# Patient Record
Sex: Female | Born: 1955 | Race: White | Hispanic: No | Marital: Married | Smoking: Never smoker
Health system: Southern US, Community
[De-identification: ages and names within clinical notes are randomized; demographics above are authoritative.]

## PROBLEM LIST (undated history)

## (undated) DIAGNOSIS — M797 Fibromyalgia: Secondary | ICD-10-CM

## (undated) DIAGNOSIS — K219 Gastro-esophageal reflux disease without esophagitis: Secondary | ICD-10-CM

## (undated) DIAGNOSIS — Z9889 Other specified postprocedural states: Secondary | ICD-10-CM

## (undated) DIAGNOSIS — G4730 Sleep apnea, unspecified: Secondary | ICD-10-CM

## (undated) DIAGNOSIS — E119 Type 2 diabetes mellitus without complications: Secondary | ICD-10-CM

## (undated) DIAGNOSIS — E611 Iron deficiency: Secondary | ICD-10-CM

## (undated) DIAGNOSIS — M1990 Unspecified osteoarthritis, unspecified site: Secondary | ICD-10-CM

## (undated) DIAGNOSIS — R112 Nausea with vomiting, unspecified: Secondary | ICD-10-CM

## (undated) DIAGNOSIS — R51 Headache: Secondary | ICD-10-CM

## (undated) DIAGNOSIS — I10 Essential (primary) hypertension: Secondary | ICD-10-CM

## (undated) DIAGNOSIS — F419 Anxiety disorder, unspecified: Secondary | ICD-10-CM

## (undated) DIAGNOSIS — I7090 Unspecified atherosclerosis: Secondary | ICD-10-CM

## (undated) DIAGNOSIS — R519 Headache, unspecified: Secondary | ICD-10-CM

## (undated) DIAGNOSIS — K5900 Constipation, unspecified: Secondary | ICD-10-CM

## (undated) DIAGNOSIS — R0609 Other forms of dyspnea: Secondary | ICD-10-CM

## (undated) HISTORY — PX: SHOULDER SURGERY: SHX246

## (undated) HISTORY — PX: TRIGGER FINGER RELEASE: SHX641

## (undated) HISTORY — PX: APPENDECTOMY: SHX54

## (undated) HISTORY — PX: COLONOSCOPY: SHX174

## (undated) HISTORY — PX: BLADDER SUSPENSION: SHX72

## (undated) HISTORY — PX: CHOLECYSTECTOMY: SHX55

## (undated) HISTORY — PX: BILATERAL CARPAL TUNNEL RELEASE: SHX6508

## (undated) HISTORY — PX: ABDOMINAL HYSTERECTOMY: SHX81

## (undated) HISTORY — PX: BACK SURGERY: SHX140

## (undated) HISTORY — PX: KNEE ARTHROPLASTY: SHX992

---

## 2015-11-21 ENCOUNTER — Other Ambulatory Visit (HOSPITAL_COMMUNITY): Payer: Self-pay | Admitting: Neurological Surgery

## 2015-11-29 ENCOUNTER — Other Ambulatory Visit: Payer: Self-pay

## 2015-11-29 ENCOUNTER — Encounter (HOSPITAL_COMMUNITY)
Admission: RE | Admit: 2015-11-29 | Discharge: 2015-11-29 | Disposition: A | Discharge status: Home / Self Care | Payer: Medicaid Other | Source: Ambulatory Visit | Attending: Neurological Surgery | Admitting: Neurological Surgery

## 2015-11-29 ENCOUNTER — Encounter (HOSPITAL_COMMUNITY): Payer: Self-pay

## 2015-11-29 ENCOUNTER — Other Ambulatory Visit (HOSPITAL_COMMUNITY): Payer: Self-pay | Admitting: *Deleted

## 2015-11-29 DIAGNOSIS — Z79899 Other long term (current) drug therapy: Secondary | ICD-10-CM | POA: Diagnosis not present

## 2015-11-29 DIAGNOSIS — IMO0002 Reserved for concepts with insufficient information to code with codable children: Secondary | ICD-10-CM

## 2015-11-29 DIAGNOSIS — M5126 Other intervertebral disc displacement, lumbar region: Secondary | ICD-10-CM | POA: Insufficient documentation

## 2015-11-29 DIAGNOSIS — Z794 Long term (current) use of insulin: Secondary | ICD-10-CM | POA: Insufficient documentation

## 2015-11-29 DIAGNOSIS — Z01812 Encounter for preprocedural laboratory examination: Secondary | ICD-10-CM | POA: Insufficient documentation

## 2015-11-29 DIAGNOSIS — K219 Gastro-esophageal reflux disease without esophagitis: Secondary | ICD-10-CM | POA: Insufficient documentation

## 2015-11-29 DIAGNOSIS — M797 Fibromyalgia: Secondary | ICD-10-CM | POA: Diagnosis not present

## 2015-11-29 DIAGNOSIS — R9431 Abnormal electrocardiogram [ECG] [EKG]: Secondary | ICD-10-CM | POA: Diagnosis not present

## 2015-11-29 DIAGNOSIS — Z01818 Encounter for other preprocedural examination: Secondary | ICD-10-CM | POA: Diagnosis present

## 2015-11-29 DIAGNOSIS — E119 Type 2 diabetes mellitus without complications: Secondary | ICD-10-CM | POA: Diagnosis not present

## 2015-11-29 DIAGNOSIS — G4733 Obstructive sleep apnea (adult) (pediatric): Secondary | ICD-10-CM | POA: Diagnosis not present

## 2015-11-29 HISTORY — DX: Iron deficiency: E61.1

## 2015-11-29 HISTORY — DX: Type 2 diabetes mellitus without complications: E11.9

## 2015-11-29 HISTORY — DX: Constipation, unspecified: K59.00

## 2015-11-29 HISTORY — DX: Gastro-esophageal reflux disease without esophagitis: K21.9

## 2015-11-29 HISTORY — DX: Headache, unspecified: R51.9

## 2015-11-29 HISTORY — DX: Sleep apnea, unspecified: G47.30

## 2015-11-29 HISTORY — DX: Nausea with vomiting, unspecified: R11.2

## 2015-11-29 HISTORY — DX: Other specified postprocedural states: Z98.890

## 2015-11-29 HISTORY — DX: Headache: R51

## 2015-11-29 HISTORY — DX: Fibromyalgia: M79.7

## 2015-11-29 HISTORY — DX: Unspecified osteoarthritis, unspecified site: M19.90

## 2015-11-29 LAB — CBC WITH DIFFERENTIAL/PLATELET
BASOS ABS: 0 10*3/uL (ref 0.0–0.1)
Basophils Relative: 0 %
EOS PCT: 4 %
Eosinophils Absolute: 0.4 10*3/uL (ref 0.0–0.7)
HEMATOCRIT: 45.7 % (ref 36.0–46.0)
HEMOGLOBIN: 14.5 g/dL (ref 12.0–15.0)
LYMPHS ABS: 3.3 10*3/uL (ref 0.7–4.0)
LYMPHS PCT: 33 %
MCH: 29.8 pg (ref 26.0–34.0)
MCHC: 31.7 g/dL (ref 30.0–36.0)
MCV: 93.8 fL (ref 78.0–100.0)
Monocytes Absolute: 0.4 10*3/uL (ref 0.1–1.0)
Monocytes Relative: 4 %
NEUTROS ABS: 6 10*3/uL (ref 1.7–7.7)
NEUTROS PCT: 59 %
Platelets: 195 10*3/uL (ref 150–400)
RBC: 4.87 MIL/uL (ref 3.87–5.11)
RDW: 14.3 % (ref 11.5–15.5)
WBC: 10.1 10*3/uL (ref 4.0–10.5)

## 2015-11-29 LAB — SURGICAL PCR SCREEN
MRSA, PCR: NEGATIVE
Staphylococcus aureus: NEGATIVE

## 2015-11-29 LAB — BASIC METABOLIC PANEL
ANION GAP: 11 (ref 5–15)
BUN: 9 mg/dL (ref 6–20)
CHLORIDE: 104 mmol/L (ref 101–111)
CO2: 26 mmol/L (ref 22–32)
Calcium: 9.3 mg/dL (ref 8.9–10.3)
Creatinine, Ser: 0.75 mg/dL (ref 0.44–1.00)
GFR calc Af Amer: >60 mL/min (ref >60)
GFR calc non Af Amer: >60 mL/min (ref >60)
Glucose, Bld: 135 mg/dL — ABNORMAL HIGH (ref 65–99)
POTASSIUM: 4.6 mmol/L (ref 3.5–5.1)
SODIUM: 141 mmol/L (ref 135–145)

## 2015-11-29 LAB — PROTIME-INR
INR: 0.94 (ref 0.00–1.49)
Prothrombin Time: 12.8 seconds (ref 11.6–15.2)

## 2015-11-29 LAB — GLUCOSE, CAPILLARY: Glucose-Capillary: 135 mg/dL — ABNORMAL HIGH (ref 65–99)

## 2015-11-29 NOTE — Progress Notes (Addendum)
Pt denies cardiac history, chest pain or sob. Pt is diabetic, thinks she had an A1C done about 3 months ago (not found in Care Everywhere) and states "it was good, 6.something". She states her fasting blood sugar usually runs between 100-120. Today's CBG was 135.  Pt is under the care of Dr. Rosario Jacks for pain management of her fibromyalgia.  EKG requested from Dr. Rodman Comp office for comparison use.

## 2015-11-29 NOTE — Pre-Procedure Instructions (Signed)
LASHONDRA VAQUERANO  11/29/2015     Your procedure is scheduled on Friday, December 01, 2015 at 11:20 AM.   Report to [facility] Entrance "A" Admitting Office at 9:15 AM.   Call this number if you have problems the morning of surgery: [phone]   Any questions prior to day of surgery, please call [phone] between 8 & 4 PM.   Remember:  Do not eat food or drink liquids after midnight Thursday, 11/30/15.  Take these medicines the morning of surgery with A SIP OF WATER: Buspirone (Buspar), Duloxetine (Cymbalta), Gabapentin (Neurontin), Tramadol - if needed. Stop NSAIDS (Diclofenac, Ibuprofen, Aleve, etc.) as of today.  How to Manage Your Diabetes Before Surgery   Why is it important to control my blood sugar before and after surgery?   Improving blood sugar levels before and after surgery helps healing and can limit problems.  A way of improving blood sugar control is eating a healthy diet by:  - Eating less sugar and carbohydrates  - Increasing activity/exercise  - Talk with your doctor about reaching your blood sugar goals  High blood sugars (greater than 180 mg/dL) can raise your risk of infections and slow down your recovery so you will need to focus on controlling your diabetes during the weeks before surgery.  Make sure that the doctor who takes care of your diabetes knows about your planned surgery including the date and location.  How do I manage my blood sugars before surgery?   Check your blood sugar at least 4 times a day, 2 days before surgery to make sure that they are not too high or low.   Check your blood sugar the morning of your surgery when you wake up and every 2 hours until you get to the Short-Stay unit.   Treat a low blood sugar (less than 70 mg/dL) with 1/2 cup of clear juice (cranberry or apple), 4 glucose tablets, OR glucose gel.   Recheck blood sugar in 15 minutes after treatment (to make sure it is greater than 70 mg/dL).  If  blood sugar is not greater than 70 mg/dL on re-check, call [phone] for further instructions.    Report your blood sugar to the Short-Stay nurse when you get to Short-Stay.  References:  [facility], 2007 "How to Manage your Diabetes Before and After Surgery".  What do I do about my diabetes medications?   Do not take oral diabetes medicines (pills) the morning of surgery.   THE NIGHT BEFORE SURGERY, take 32 units of Lantus Insulin.  THE NIGHT BEFORE SURGERY, take your regular dose of Victoza   Do not wear jewelry, make-up or nail polish.  Do not wear lotions, powders, or perfumes.  You may wear deodorant.  Do not shave 48 hours prior to surgery.    Do not bring valuables to the hospital.  [facility] is not responsible for any belongings or valuables.  Contacts, dentures or bridgework may not be worn into surgery.  Leave your suitcase in the car.  After surgery it may be brought to your room.  For patients admitted to the hospital, discharge time will be determined by your treatment team.   Special instructions: [place] - Preparing for Surgery  Before surgery, you can play an important role.  Because skin is not sterile, your skin needs to be as free of germs as possible.  You can reduce the number of germs on you skin by washing with CHG (  chlorahexidine gluconate) soap before surgery.  CHG is an antiseptic cleaner which kills germs and bonds with the skin to continue killing germs even after washing.  Please DO NOT use if you have an allergy to CHG or antibacterial soaps.  If your skin becomes reddened/irritated stop using the CHG and inform your nurse when you arrive at Short Stay.  Do not shave (including legs and underarms) for at least 48 hours prior to the first CHG shower.  You may shave your face.  Please follow these instructions carefully:   1.  Shower with CHG Soap the night before surgery and the                                 morning of Surgery.  2.  If you choose to wash your hair, wash your hair first as usual with your       normal shampoo.  3.  After you shampoo, rinse your hair and body thoroughly to remove the                      Shampoo.  4.  Use CHG as you would any other liquid soap.  You can apply chg directly       to the skin and wash gently with scrungie or a clean washcloth.  5.  Apply the CHG Soap to your body ONLY FROM THE NECK DOWN.        Do not use on open wounds or open sores.  Avoid contact with your eyes, ears, mouth and genitals (private parts).  Wash genitals (private parts) with your normal soap.  6.  Wash thoroughly, paying special attention to the area where your surgery        will be performed.  7.  Thoroughly rinse your body with warm water from the neck down.  8.  DO NOT shower/wash with your normal soap after using and rinsing off       the CHG Soap.  9.  Pat yourself dry with a clean towel.            10.  Wear clean pajamas.            11.  Place clean sheets on your bed the night of your first shower and do not        sleep with pets.  Day of Surgery  Do not apply any lotions the morning of surgery.  Please wear clean clothes to the hospital.   Please read over the following fact sheets that you were given. Pain Booklet, Coughing and Deep Breathing, MRSA Information and Surgical Site Infection Prevention

## 2015-11-29 NOTE — Progress Notes (Addendum)
Anesthesia Chart Review: Patient is a 59 year old female scheduled for extra microdiscectomy L3-4 on 12/01/15 by Dr. Yetta Barre.  History includes non-smoker, post-op N/V, DM2, OSA (occasional CPAP use), GERD, migraines, arthritis, fibromyalgia (Dr. Rosario Jacks), iron deficiency, cholecystectomy, hysterectomy, appendectomy. BMI is consistent with obesity. PCP is Dr. Brooke Bonito, last visit 11/27/15 for URI and also neck cellulitis from parrot bite (started on 7 day course of doxycycline). No fever. He described her right neck wound as "resolving." He is aware of surgery plans.    Meds include Buspar, Invokana, Welchol, Flexeril, Voltaren, doxycycline, Nexium, Lasix, Neurontin, Lantus, Victoza, Claritin, metformin, tramadol.   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  11/29/15 CXR: IMPRESSION: No active disease.  Preoperative labs noted. A1C is pending. Reports fasting glucose runs ~ 100-120.  EKG reviewed with anesthesiologist Dr. Desmond Lope. Will follow-up if any additional tracing from her PCP. She denied CP and SOB.  Will follow-up with Dr. Yetta Barre' office to make sure they are aware of patient's current treatment for cellulitis.  Velna Ochs [facility] Short Stay Center/Anesthesiology Phone [phone] 11/29/2015 5:51 PM  Addendum: I called and spoke with patient. She feels her URI is getting better. Still has some sinus drainage. No fever. Occasional cough, scant "watery" discharge. No wheezing. She said parrot bite was from two weeks ago (he nipped at her neck in attempt to prevent falling). She said it scabbed and was a little read beneath the scab so her PCP started her on antibiotics. She is not inclined to think it is acutely infected. She denied CP, SOB or history of CAD/MI. Her activity has been limited due to her back pain which is progressively worsening. We did get a comparison EKG from 2010 from [facility] which shows  poor anterior r wave progression and consider inferior infarct which I think is overall similar to her tracing from 11/29/15. I have notified Erie Noe at Dr. Yetta Barre' office regarding patients recent URI, parrot bite, and that she is on doxycycline. Patient describes to me rather mild symptoms. I was not asked to evaluate her at PAT. I did notify her that if she appears ill, has wheezing, fever, or if her neck looks infected that these could all affect the timing of her surgery. Definitve anesthesia plan following surgeon and anesthesiologist evaluation tomorrow.  Velna Ochs [facility] Short Stay Center/Anesthesiology Phone [phone] 11/30/2015 11:08 AM

## 2015-11-30 LAB — HEMOGLOBIN A1C
Hgb A1c MFr Bld: 6.2 % — ABNORMAL HIGH (ref 4.8–5.6)
MEAN PLASMA GLUCOSE: 131 mg/dL

## 2015-11-30 MED ORDER — DEXAMETHASONE SODIUM PHOSPHATE 10 MG/ML IJ SOLN
10.0000 mg | INTRAMUSCULAR | Status: DC
  Filled 2015-11-30: qty 1

## 2015-11-30 MED ORDER — CEFAZOLIN SODIUM-DEXTROSE 2-3 GM-% IV SOLR
2.0000 g | INTRAVENOUS | Status: AC
  Administered 2015-12-01: 2 g via INTRAVENOUS
  Filled 2015-11-30: qty 50

## 2015-12-01 ENCOUNTER — Ambulatory Visit (HOSPITAL_COMMUNITY): Payer: Medicaid Other | Admitting: Vascular Surgery

## 2015-12-01 ENCOUNTER — Inpatient Hospital Stay (HOSPITAL_COMMUNITY)
Admission: RE | Admit: 2015-12-01 | Discharge: 2015-12-01 | DRG: 520 | Disposition: A | Discharge status: Home / Self Care | Payer: Medicaid Other | Source: Ambulatory Visit | Attending: Neurological Surgery | Admitting: Neurological Surgery

## 2015-12-01 ENCOUNTER — Encounter (HOSPITAL_COMMUNITY): Payer: Self-pay | Admitting: *Deleted

## 2015-12-01 ENCOUNTER — Encounter (HOSPITAL_COMMUNITY)
Admission: RE | Disposition: A | Discharge status: Home / Self Care | Payer: Self-pay | Source: Ambulatory Visit | Attending: Neurological Surgery

## 2015-12-01 ENCOUNTER — Ambulatory Visit (HOSPITAL_COMMUNITY): Payer: Medicaid Other | Admitting: Certified Registered Nurse Anesthetist

## 2015-12-01 ENCOUNTER — Ambulatory Visit (HOSPITAL_COMMUNITY): Payer: Medicaid Other

## 2015-12-01 DIAGNOSIS — M797 Fibromyalgia: Secondary | ICD-10-CM | POA: Diagnosis present

## 2015-12-01 DIAGNOSIS — Z79899 Other long term (current) drug therapy: Secondary | ICD-10-CM

## 2015-12-01 DIAGNOSIS — Z888 Allergy status to other drugs, medicaments and biological substances status: Secondary | ICD-10-CM | POA: Diagnosis not present

## 2015-12-01 DIAGNOSIS — Z7984 Long term (current) use of oral hypoglycemic drugs: Secondary | ICD-10-CM | POA: Diagnosis not present

## 2015-12-01 DIAGNOSIS — G4730 Sleep apnea, unspecified: Secondary | ICD-10-CM | POA: Diagnosis present

## 2015-12-01 DIAGNOSIS — Z794 Long term (current) use of insulin: Secondary | ICD-10-CM | POA: Diagnosis not present

## 2015-12-01 DIAGNOSIS — M5126 Other intervertebral disc displacement, lumbar region: Principal | ICD-10-CM | POA: Diagnosis present

## 2015-12-01 DIAGNOSIS — Z9889 Other specified postprocedural states: Secondary | ICD-10-CM

## 2015-12-01 DIAGNOSIS — Z419 Encounter for procedure for purposes other than remedying health state, unspecified: Secondary | ICD-10-CM

## 2015-12-01 DIAGNOSIS — E119 Type 2 diabetes mellitus without complications: Secondary | ICD-10-CM | POA: Diagnosis present

## 2015-12-01 HISTORY — PX: LUMBAR LAMINECTOMY/DECOMPRESSION MICRODISCECTOMY: SHX5026

## 2015-12-01 LAB — GLUCOSE, CAPILLARY
GLUCOSE-CAPILLARY: 106 mg/dL — AB (ref 65–99)
GLUCOSE-CAPILLARY: 89 mg/dL (ref 65–99)
Glucose-Capillary: 123 mg/dL — ABNORMAL HIGH (ref 65–99)

## 2015-12-01 SURGERY — LUMBAR LAMINECTOMY/DECOMPRESSION MICRODISCECTOMY 1 LEVEL
Anesthesia: General | Site: Back | Laterality: Right

## 2015-12-01 MED ORDER — EPHEDRINE SULFATE 50 MG/ML IJ SOLN
INTRAMUSCULAR | Status: AC
  Filled 2015-12-01: qty 1

## 2015-12-01 MED ORDER — CEFAZOLIN SODIUM 1-5 GM-% IV SOLN
1.0000 g | Freq: Four times a day (QID) | INTRAVENOUS | Status: DC
  Administered 2015-12-01: 1 g via INTRAVENOUS
  Filled 2015-12-01 (×2): qty 50

## 2015-12-01 MED ORDER — INSULIN ASPART 100 UNIT/ML ~~LOC~~ SOLN
0.0000 [IU] | Freq: Three times a day (TID) | SUBCUTANEOUS | Status: DC
  Filled 2015-12-01 (×25): qty 0.15

## 2015-12-01 MED ORDER — SCOPOLAMINE 1 MG/3DAYS TD PT72
MEDICATED_PATCH | TRANSDERMAL | Status: DC | PRN
  Administered 2015-12-01: 1 via TRANSDERMAL

## 2015-12-01 MED ORDER — POTASSIUM CHLORIDE IN NACL 20-0.9 MEQ/L-% IV SOLN
INTRAVENOUS | Status: DC
  Administered 2015-12-01: 16:00:00 via INTRAVENOUS
  Filled 2015-12-01 (×2): qty 1000

## 2015-12-01 MED ORDER — ROCURONIUM BROMIDE 50 MG/5ML IV SOLN
INTRAVENOUS | Status: AC
  Filled 2015-12-01: qty 1

## 2015-12-01 MED ORDER — OXYCODONE-ACETAMINOPHEN 5-325 MG PO TABS: 1.0000 | ORAL_TABLET | Freq: Four times a day (QID) | ORAL | Status: DC | PRN

## 2015-12-01 MED ORDER — GLYCOPYRROLATE 0.2 MG/ML IJ SOLN
INTRAMUSCULAR | Status: AC
  Filled 2015-12-01: qty 3

## 2015-12-01 MED ORDER — ACETAMINOPHEN 650 MG RE SUPP: 650.0000 mg | RECTAL | Status: DC | PRN

## 2015-12-01 MED ORDER — CYCLOBENZAPRINE HCL 10 MG PO TABS
10.0000 mg | ORAL_TABLET | Freq: Three times a day (TID) | ORAL | Status: DC | PRN
  Administered 2015-12-01 (×2): 10 mg via ORAL
  Filled 2015-12-01 (×2): qty 1

## 2015-12-01 MED ORDER — OXYCODONE-ACETAMINOPHEN 5-325 MG PO TABS
1.0000 | ORAL_TABLET | ORAL | Status: DC | PRN
  Administered 2015-12-01 (×2): 2 via ORAL
  Filled 2015-12-01 (×2): qty 2

## 2015-12-01 MED ORDER — CANAGLIFLOZIN 300 MG PO TABS
300.0000 mg | ORAL_TABLET | Freq: Every day | ORAL | Status: DC
  Filled 2015-12-01: qty 300

## 2015-12-01 MED ORDER — MIDAZOLAM HCL 5 MG/5ML IJ SOLN
INTRAMUSCULAR | Status: DC | PRN
  Administered 2015-12-01: 2 mg via INTRAVENOUS

## 2015-12-01 MED ORDER — 0.9 % SODIUM CHLORIDE (POUR BTL) OPTIME
TOPICAL | Status: DC | PRN
  Administered 2015-12-01: 1000 mL

## 2015-12-01 MED ORDER — LACTATED RINGERS IV SOLN
INTRAVENOUS | Status: DC
  Administered 2015-12-01 (×3): via INTRAVENOUS

## 2015-12-01 MED ORDER — ACETAMINOPHEN 325 MG PO TABS: 650.0000 mg | ORAL_TABLET | ORAL | Status: DC | PRN

## 2015-12-01 MED ORDER — ONDANSETRON HCL 4 MG/2ML IJ SOLN
INTRAMUSCULAR | Status: AC
  Filled 2015-12-01: qty 2

## 2015-12-01 MED ORDER — PROPOFOL 10 MG/ML IV BOLUS
INTRAVENOUS | Status: AC
  Filled 2015-12-01: qty 20

## 2015-12-01 MED ORDER — HYDROMORPHONE HCL 1 MG/ML IJ SOLN
0.2500 mg | INTRAMUSCULAR | Status: DC | PRN
  Administered 2015-12-01 (×2): 0.5 mg via INTRAVENOUS

## 2015-12-01 MED ORDER — PROPOFOL 10 MG/ML IV BOLUS
INTRAVENOUS | Status: DC | PRN
  Administered 2015-12-01: 150 mg via INTRAVENOUS

## 2015-12-01 MED ORDER — DIPHENHYDRAMINE HCL 50 MG/ML IJ SOLN
INTRAMUSCULAR | Status: AC
  Filled 2015-12-01: qty 1

## 2015-12-01 MED ORDER — HYDROMORPHONE HCL 1 MG/ML IJ SOLN
INTRAMUSCULAR | Status: AC
  Filled 2015-12-01: qty 1

## 2015-12-01 MED ORDER — PHENYLEPHRINE 40 MCG/ML (10ML) SYRINGE FOR IV PUSH (FOR BLOOD PRESSURE SUPPORT)
PREFILLED_SYRINGE | INTRAVENOUS | Status: AC
  Filled 2015-12-01: qty 10

## 2015-12-01 MED ORDER — BUPIVACAINE HCL (PF) 0.25 % IJ SOLN
INTRAMUSCULAR | Status: DC | PRN
  Administered 2015-12-01: 4 mL via INTRA_ARTICULAR

## 2015-12-01 MED ORDER — THROMBIN 5000 UNITS EX SOLR
CUTANEOUS | Status: DC | PRN
  Administered 2015-12-01 (×3): 5000 [IU] via TOPICAL

## 2015-12-01 MED ORDER — DIPHENHYDRAMINE HCL 50 MG/ML IJ SOLN
INTRAMUSCULAR | Status: DC | PRN
  Administered 2015-12-01: 6.25 mg via INTRAVENOUS

## 2015-12-01 MED ORDER — NEOSTIGMINE METHYLSULFATE 10 MG/10ML IV SOLN
INTRAVENOUS | Status: DC | PRN
  Administered 2015-12-01: 4 mg via INTRAVENOUS

## 2015-12-01 MED ORDER — LIDOCAINE HCL (CARDIAC) 20 MG/ML IV SOLN
INTRAVENOUS | Status: DC | PRN
  Administered 2015-12-01: 100 mg via INTRAVENOUS

## 2015-12-01 MED ORDER — LIDOCAINE HCL (CARDIAC) 20 MG/ML IV SOLN
INTRAVENOUS | Status: AC
  Filled 2015-12-01: qty 5

## 2015-12-01 MED ORDER — MORPHINE SULFATE (PF) 2 MG/ML IV SOLN
2.0000 mg | INTRAVENOUS | Status: DC | PRN
  Administered 2015-12-01 (×2): 4 mg via INTRAVENOUS
  Filled 2015-12-01 (×2): qty 2

## 2015-12-01 MED ORDER — HEMOSTATIC AGENTS (NO CHARGE) OPTIME
TOPICAL | Status: DC | PRN
  Administered 2015-12-01: 1 via TOPICAL

## 2015-12-01 MED ORDER — SODIUM CHLORIDE 0.9 % IJ SOLN: 3.0000 mL | Freq: Two times a day (BID) | INTRAMUSCULAR | Status: DC

## 2015-12-01 MED ORDER — NEOSTIGMINE METHYLSULFATE 10 MG/10ML IV SOLN
INTRAVENOUS | Status: AC
  Filled 2015-12-01: qty 1

## 2015-12-01 MED ORDER — SCOPOLAMINE 1 MG/3DAYS TD PT72
MEDICATED_PATCH | TRANSDERMAL | Status: AC
  Filled 2015-12-01: qty 1

## 2015-12-01 MED ORDER — SODIUM CHLORIDE 0.9 % IV SOLN: 250.0000 mL | INTRAVENOUS | Status: DC

## 2015-12-01 MED ORDER — BUSPIRONE HCL 10 MG PO TABS: 10.0000 mg | ORAL_TABLET | Freq: Two times a day (BID) | ORAL | Status: DC

## 2015-12-01 MED ORDER — PROMETHAZINE HCL 25 MG/ML IJ SOLN: 6.2500 mg | INTRAMUSCULAR | Status: DC | PRN

## 2015-12-01 MED ORDER — PHENOL 1.4 % MT LIQD
1.0000 | OROMUCOSAL | Status: DC | PRN
Start: 2015-12-01 — End: 2015-12-02

## 2015-12-01 MED ORDER — ONDANSETRON HCL 4 MG/2ML IJ SOLN: 4.0000 mg | INTRAMUSCULAR | Status: DC | PRN

## 2015-12-01 MED ORDER — FENTANYL CITRATE (PF) 250 MCG/5ML IJ SOLN
INTRAMUSCULAR | Status: AC
  Filled 2015-12-01: qty 5

## 2015-12-01 MED ORDER — MIDAZOLAM HCL 2 MG/2ML IJ SOLN
INTRAMUSCULAR | Status: AC
  Filled 2015-12-01: qty 2

## 2015-12-01 MED ORDER — THROMBIN 5000 UNITS EX SOLR
OROMUCOSAL | Status: DC | PRN
  Administered 2015-12-01: 12:00:00 via TOPICAL

## 2015-12-01 MED ORDER — ONDANSETRON HCL 4 MG/2ML IJ SOLN
INTRAMUSCULAR | Status: DC | PRN
  Administered 2015-12-01: 4 mg via INTRAVENOUS

## 2015-12-01 MED ORDER — LIRAGLUTIDE 18 MG/3ML ~~LOC~~ SOPN: 18.0000 mg | PEN_INJECTOR | Freq: Every day | SUBCUTANEOUS | Status: DC

## 2015-12-01 MED ORDER — FENTANYL CITRATE (PF) 100 MCG/2ML IJ SOLN
INTRAMUSCULAR | Status: DC | PRN
  Administered 2015-12-01 (×2): 50 ug via INTRAVENOUS
  Administered 2015-12-01: 100 ug via INTRAVENOUS
  Administered 2015-12-01: 50 ug via INTRAVENOUS

## 2015-12-01 MED ORDER — BACITRACIN 50000 UNITS IM SOLR
INTRAMUSCULAR | Status: DC | PRN
  Administered 2015-12-01: 12:00:00

## 2015-12-01 MED ORDER — SODIUM CHLORIDE 0.9 % IJ SOLN: 3.0000 mL | INTRAMUSCULAR | Status: DC | PRN

## 2015-12-01 MED ORDER — ROCURONIUM BROMIDE 100 MG/10ML IV SOLN
INTRAVENOUS | Status: DC | PRN
  Administered 2015-12-01: 10 mg via INTRAVENOUS
  Administered 2015-12-01: 50 mg via INTRAVENOUS
  Administered 2015-12-01: 10 mg via INTRAVENOUS

## 2015-12-01 MED ORDER — FUROSEMIDE 40 MG PO TABS: 40.0000 mg | ORAL_TABLET | Freq: Every day | ORAL | Status: DC

## 2015-12-01 MED ORDER — GABAPENTIN 400 MG PO CAPS
800.0000 mg | ORAL_CAPSULE | Freq: Two times a day (BID) | ORAL | Status: DC
  Administered 2015-12-01: 800 mg via ORAL
  Filled 2015-12-01: qty 2

## 2015-12-01 MED ORDER — METFORMIN HCL 500 MG PO TABS: 500.0000 mg | ORAL_TABLET | Freq: Three times a day (TID) | ORAL | Status: DC

## 2015-12-01 MED ORDER — GLYCOPYRROLATE 0.2 MG/ML IJ SOLN
INTRAMUSCULAR | Status: DC | PRN
  Administered 2015-12-01: 0.6 mg via INTRAVENOUS

## 2015-12-01 MED ORDER — ALBUTEROL SULFATE HFA 108 (90 BASE) MCG/ACT IN AERS
INHALATION_SPRAY | RESPIRATORY_TRACT | Status: DC | PRN
  Administered 2015-12-01: 8 via RESPIRATORY_TRACT

## 2015-12-01 MED ORDER — SUCCINYLCHOLINE CHLORIDE 20 MG/ML IJ SOLN
INTRAMUSCULAR | Status: AC
  Filled 2015-12-01: qty 1

## 2015-12-01 MED ORDER — MENTHOL 3 MG MT LOZG: 1.0000 | LOZENGE | OROMUCOSAL | Status: DC | PRN

## 2015-12-01 MED ORDER — TEMAZEPAM 15 MG PO CAPS
30.0000 mg | ORAL_CAPSULE | Freq: Every day | ORAL | Status: DC
  Administered 2015-12-01: 30 mg via ORAL
  Filled 2015-12-01: qty 2

## 2015-12-01 MED ORDER — PANTOPRAZOLE SODIUM 40 MG PO TBEC
40.0000 mg | DELAYED_RELEASE_TABLET | Freq: Every day | ORAL | Status: DC
  Administered 2015-12-01: 40 mg via ORAL
  Filled 2015-12-01: qty 1

## 2015-12-01 MED ORDER — PROPOFOL 500 MG/50ML IV EMUL
INTRAVENOUS | Status: DC | PRN
  Administered 2015-12-01: 150 ug/kg/min via INTRAVENOUS

## 2015-12-01 MED ORDER — METOCLOPRAMIDE HCL 5 MG/ML IJ SOLN
INTRAMUSCULAR | Status: AC
  Filled 2015-12-01: qty 2

## 2015-12-01 MED ORDER — DOXYCYCLINE HYCLATE 100 MG PO TABS
100.0000 mg | ORAL_TABLET | Freq: Two times a day (BID) | ORAL | Status: DC
  Administered 2015-12-01: 100 mg via ORAL
  Filled 2015-12-01: qty 1

## 2015-12-01 MED ORDER — DULOXETINE HCL 60 MG PO CPEP
60.0000 mg | ORAL_CAPSULE | Freq: Every day | ORAL | Status: DC
  Administered 2015-12-01: 60 mg via ORAL
  Filled 2015-12-01: qty 1

## 2015-12-01 SURGICAL SUPPLY — 40 items
BAG DECANTER FOR FLEXI CONT (MISCELLANEOUS) ×3 IMPLANT
BENZOIN TINCTURE PRP APPL 2/3 (GAUZE/BANDAGES/DRESSINGS) ×3 IMPLANT
BUR MATCHSTICK NEURO 3.0 LAGG (BURR) ×3 IMPLANT
CANISTER SUCT 3000ML PPV (MISCELLANEOUS) ×3 IMPLANT
CLOSURE WOUND 1/2 X4 (GAUZE/BANDAGES/DRESSINGS) ×1
DRAPE LAPAROTOMY 100X72X124 (DRAPES) ×3 IMPLANT
DRAPE MICROSCOPE LEICA (MISCELLANEOUS) ×3 IMPLANT
DRAPE POUCH INSTRU U-SHP 10X18 (DRAPES) ×3 IMPLANT
DRAPE SURG 17X23 STRL (DRAPES) ×3 IMPLANT
DRSG OPSITE POSTOP 4X6 (GAUZE/BANDAGES/DRESSINGS) ×3 IMPLANT
DURAPREP 26ML APPLICATOR (WOUND CARE) ×3 IMPLANT
ELECT REM PT RETURN 9FT ADLT (ELECTROSURGICAL) ×3
ELECTRODE REM PT RTRN 9FT ADLT (ELECTROSURGICAL) ×1 IMPLANT
GAUZE SPONGE 4X4 16PLY XRAY LF (GAUZE/BANDAGES/DRESSINGS) IMPLANT
GLOVE BIO SURGEON STRL SZ8 (GLOVE) ×3 IMPLANT
GLOVE ECLIPSE 7.5 STRL STRAW (GLOVE) ×3 IMPLANT
GLOVE INDICATOR 8.0 STRL GRN (GLOVE) ×3 IMPLANT
GOWN STRL REUS W/ TWL LRG LVL3 (GOWN DISPOSABLE) IMPLANT
GOWN STRL REUS W/ TWL XL LVL3 (GOWN DISPOSABLE) ×1 IMPLANT
GOWN STRL REUS W/TWL 2XL LVL3 (GOWN DISPOSABLE) IMPLANT
GOWN STRL REUS W/TWL LRG LVL3 (GOWN DISPOSABLE)
GOWN STRL REUS W/TWL XL LVL3 (GOWN DISPOSABLE) ×2
HEMOSTAT POWDER KIT SURGIFOAM (HEMOSTASIS) IMPLANT
KIT BASIN OR (CUSTOM PROCEDURE TRAY) ×3 IMPLANT
KIT ROOM TURNOVER OR (KITS) ×3 IMPLANT
NEEDLE HYPO 25X1 1.5 SAFETY (NEEDLE) ×3 IMPLANT
NEEDLE SPNL 20GX3.5 QUINCKE YW (NEEDLE) ×3 IMPLANT
NS IRRIG 1000ML POUR BTL (IV SOLUTION) ×3 IMPLANT
PACK LAMINECTOMY NEURO (CUSTOM PROCEDURE TRAY) ×3 IMPLANT
PAD ARMBOARD 7.5X6 YLW CONV (MISCELLANEOUS) ×9 IMPLANT
RUBBERBAND STERILE (MISCELLANEOUS) ×6 IMPLANT
SPONGE SURGIFOAM ABS GEL SZ50 (HEMOSTASIS) ×3 IMPLANT
STRIP CLOSURE SKIN 1/2X4 (GAUZE/BANDAGES/DRESSINGS) ×2 IMPLANT
SUT VIC AB 0 CT1 18XCR BRD8 (SUTURE) ×1 IMPLANT
SUT VIC AB 0 CT1 8-18 (SUTURE) ×2
SUT VIC AB 2-0 CP2 18 (SUTURE) ×3 IMPLANT
SUT VIC AB 3-0 SH 8-18 (SUTURE) ×3 IMPLANT
TOWEL OR 17X24 6PK STRL BLUE (TOWEL DISPOSABLE) ×3 IMPLANT
TOWEL OR 17X26 10 PK STRL BLUE (TOWEL DISPOSABLE) ×3 IMPLANT
WATER STERILE IRR 1000ML POUR (IV SOLUTION) ×3 IMPLANT

## 2015-12-01 NOTE — Transfer of Care (Signed)
Immediate Anesthesia Transfer of Care Note  Patient: Gabrielle Esparza  Procedure(s) Performed: Procedure(s):  extraforaminal Microdiscectomy  - Right Lumbar three-lumbar four  (Right)  Patient Location: PACU  Anesthesia Type:General  Level of Consciousness: awake  Airway & Oxygen Therapy: Patient Spontanous Breathing and Patient connected to face mask oxygen  Post-op Assessment: Report given to RN, Post -op Vital signs reviewed and stable and Patient moving all extremities X 4  Post vital signs: stable  Last Vitals:  Filed Vitals:   12/01/15 0906  BP: 131/71  Pulse: 75  Temp: 36.7 C  Resp: 18    Complications: No apparent anesthesia complications

## 2015-12-01 NOTE — Anesthesia Preprocedure Evaluation (Addendum)
Anesthesia Evaluation  Patient identified by MRN, date of birth, ID band Patient awake    Reviewed: Allergy & Precautions, NPO status , Patient's Chart, lab work & pertinent test results  History of Anesthesia Complications (+) PONV and history of anesthetic complications  Airway Mallampati: II  TM Distance: <3 FB Neck ROM: Full    Dental  (+) Teeth Intact, Dental Advisory Given, Caps   Pulmonary sleep apnea and Continuous Positive Airway Pressure Ventilation , Recent URI , Residual Cough,    Pulmonary exam normal breath sounds clear to auscultation       Cardiovascular Exercise Tolerance: Good (-) hypertension(-) angina(-) CAD and (-) Past MI Normal cardiovascular exam Rhythm:Regular Rate:Normal  11/29/15 EKG: NSR, inferior infarct (age undetermined), anterior infarct (age undetermined).   Neuro/Psych  Headaches, PSYCHIATRIC DISORDERS Anxiety Depression    GI/Hepatic Neg liver ROS, GERD  Medicated,  Endo/Other  diabetes, Type 2, Insulin Dependent, Oral Hypoglycemic AgentsObesity   Renal/GU negative Renal ROS     Musculoskeletal  (+) Arthritis , Fibromyalgia -, narcotic dependent  Abdominal   Peds  Hematology negative hematology ROS (+)   Anesthesia Other Findings Day of surgery medications reviewed with the patient.  Parrot bite to neck 2 weeks ago.  Started on doxycycline by PCP for 7 day course starting 11/27/15  Reproductive/Obstetrics                         Anesthesia Physical Anesthesia Plan  ASA: III  Anesthesia Plan: General   Post-op Pain Management:    Induction: Intravenous  Airway Management Planned: Oral ETT  Additional Equipment:   Intra-op Plan:   Post-operative Plan: Extubation in OR  Informed Consent: I have reviewed the patients History and Physical, chart, labs and discussed the procedure including the risks, benefits and alternatives for the proposed  anesthesia with the patient or authorized representative who has indicated his/her understanding and acceptance.   Dental advisory given  Plan Discussed with: CRNA  Anesthesia Plan Comments: (Risks/benefits of general anesthesia discussed with patient including risk of damage to teeth, lips, gum, and tongue, nausea/vomiting, allergic reactions to medications, and the possibility of heart attack, stroke and death.  All patient questions answered.  Patient wishes to proceed.  Recent URI, started on doxycycline 12/19. Denies fever, chills, productive cough.  Residual dry cough today.  States breathing is at baseline. Lungs clear to auscultation.)       Anesthesia Quick Evaluation

## 2015-12-01 NOTE — Discharge Summary (Signed)
Physician Discharge Summary  Patient ID: Gabrielle Esparza MRN: [id number] DOB/AGE: Aug 10, 1956 59 y.o.  Admit date: 12/01/2015 Discharge date: 12/01/2015  Admission Diagnoses: HNP    Discharge Diagnoses: same   Discharged Condition: good  Hospital Course: The patient was admitted on 12/01/2015 and taken to the operating room where the patient underwent R L3-4 microdiskectomy. The patient tolerated the procedure well and was taken to the recovery room and then to the floor in stable condition. The hospital course was routine. There were no complications. The wound remained clean dry and intact. Pt had appropriate back soreness. No complaints of leg pain or new N/T/W. The patient remained afebrile with stable vital signs, and tolerated a regular diet. The patient continued to increase activities, and pain was well controlled with oral pain medications.   Consults: None  Significant Diagnostic Studies:  Results for orders placed or performed during the hospital encounter of 12/01/15  Glucose, capillary  Result Value Ref Range   Glucose-Capillary 123 (H) 65 - 99 mg/dL  Glucose, capillary  Result Value Ref Range   Glucose-Capillary 106 (H) 65 - 99 mg/dL  Glucose, capillary  Result Value Ref Range   Glucose-Capillary 89 65 - 99 mg/dL   Comment 1 Notify RN    Comment 2 Document in Chart     Chest 2 View  11/29/2015  CLINICAL DATA:  Preoperative evaluation for upcoming lumbar surgery EXAM: CHEST - 2 VIEW COMPARISON:  None. FINDINGS: The heart size and mediastinal contours are within normal limits. Both lungs are clear. The visualized skeletal structures are unremarkable. IMPRESSION: No active disease. Electronically Signed   By: Alcide Clever M.D.   On: 11/29/2015 10:47   Dg Lumbar Spine 2-3 Views  12/01/2015  CLINICAL DATA:  59 year old female with a history of micro discectomy, L3-L4 EXAM: LUMBAR SPINE - 2-3 VIEW COMPARISON:  MRI 09/26/2015 FINDINGS: Intraoperative cross-table  lateral of the lumbar spine during micro discectomy. Initial image demonstrates surgical needle at the posterior aspect of the disc space of L2-L3 overlying the posterior elements. Subsequent image demonstrates surgical curette identifying the foramen of L3 overlying the L3 posterior elements. IMPRESSION: Intraoperative cross-table lateral images during L3-L4 micro discectomy, as above. Please refer to the dictated operative report for full details of intraoperative findings and procedure. Signed, Yvone Neu. Loreta Ave, DO [facility] Radiology Electronically Signed   By: Gilmer Mor D.O.   On: 12/01/2015 15:07    Antibiotics:  Anti-infectives    Start     Dose/Rate Route Frequency Ordered Stop   12/01/15 2200  doxycycline (VIBRA-TABS) tablet 100 mg     100 mg Oral 2 times daily 12/01/15 1505     12/01/15 1800  ceFAZolin (ANCEF) IVPB 1 g/50 mL premix     1 g 100 mL/hr over 30 Minutes Intravenous 4 times per day 12/01/15 1508 12/02/15 0559   12/01/15 1317  bacitracin 50,000 Units in sodium chloride irrigation 0.9 % 500 mL irrigation  Status:  Discontinued       As needed 12/01/15 1324 12/01/15 1354   12/01/15 1100  ceFAZolin (ANCEF) IVPB 2 g/50 mL premix     2 g 100 mL/hr over 30 Minutes Intravenous To ShortStay Surgical 11/30/15 0846 12/01/15 1229      Discharge Exam: Blood pressure 124/57, pulse 85, temperature 98.4 F (36.9 C), temperature source Oral, resp. rate 20, height 5\' 4"  (1.626 m), weight 99.899 kg (220 lb 3.8 oz), SpO2 96 %. Neurologic: Grossly normal Dressing dry  Discharge Medications:     Medication List    STOP taking these medications        HYDROcodone-homatropine 5-1.5 MG/5ML syrup  Commonly known as:  HYCODAN      TAKE these medications        busPIRone 5 MG tablet  Commonly known as:  BUSPAR  Take 10 mg by mouth 2 (two) times daily.     colesevelam 625 MG tablet  Commonly known as:  WELCHOL  Take 1,875 mg by  mouth 2 (two) times daily with a meal.     cyclobenzaprine 10 MG tablet  Commonly known as:  FLEXERIL  Take 10 mg by mouth 3 (three) times daily as needed for muscle spasms.     diclofenac 50 MG EC tablet  Commonly known as:  VOLTAREN  Take 50 mg by mouth 2 (two) times daily.     doxycycline 100 MG tablet  Commonly known as:  VIBRA-TABS  Take 100 mg by mouth 2 (two) times daily.     DULoxetine 60 MG capsule  Commonly known as:  CYMBALTA  Take 60 mg by mouth daily.     esomeprazole 40 MG capsule  Commonly known as:  NEXIUM  Take 40 mg by mouth daily at 12 noon.     furosemide 40 MG tablet  Commonly known as:  LASIX  Take 40 mg by mouth.     gabapentin 400 MG capsule  Commonly known as:  NEURONTIN  Take 800 mg by mouth 2 (two) times daily.     insulin glargine 100 UNIT/ML injection  Commonly known as:  LANTUS  Inject 40 Units into the skin at bedtime.     INVOKANA 300 MG Tabs tablet  Generic drug:  canagliflozin  Take 300 mg by mouth daily before breakfast.     loratadine 10 MG tablet  Commonly known as:  CLARITIN  Take 10 mg by mouth daily.     metFORMIN 500 MG tablet  Commonly known as:  GLUCOPHAGE  Take 500 mg by mouth 3 (three) times daily.     oxyCODONE-acetaminophen 5-325 MG tablet  Commonly known as:  PERCOCET/ROXICET  Take 1-2 tablets by mouth every 6 (six) hours as needed for moderate pain.     temazepam 30 MG capsule  Commonly known as:  RESTORIL  Take 30 mg by mouth at bedtime as needed for sleep.     traMADol 50 MG tablet  Commonly known as:  ULTRAM  Take 50 mg by mouth every 8 (eight) hours as needed for moderate pain.     VICTOZA 18 MG/3ML Sopn  Generic drug:  Liraglutide  Inject 18 mg into the skin at bedtime.     vitamin B-12 1000 MCG tablet  Commonly known as:  CYANOCOBALAMIN  Take 1,000 mcg by mouth daily.        Disposition: home   Final Dx: L3-4 microdiskectomy      Discharge Instructions     Remove dressing in 72 hours     Complete by:  As directed      Call MD for:  difficulty breathing, headache or visual disturbances    Complete by:  As directed      Call MD for:  hives    Complete by:  As directed      Call MD for:  persistant nausea and vomiting    Complete by:  As directed      Call MD for:  redness, tenderness, or signs of infection (pain,  swelling, redness, odor or green/yellow discharge around incision site)    Complete by:  As directed      Call MD for:  severe uncontrolled pain    Complete by:  As directed      Call MD for:  temperature >100.4    Complete by:  As directed      Diet - low sodium heart healthy    Complete by:  As directed      Discharge instructions    Complete by:  As directed   No driving, no bending, no heavy lifting     Increase activity slowly    Complete by:  As directed               Signed: Maaz Spiering S 12/01/2015, 8:07 PM

## 2015-12-01 NOTE — H&P (Signed)
Subjective: Patient is a 59 y.o. female admitted for leg pain. Onset of symptoms was a few months ago, rapidly worsening since that time.  The pain is rated intense, and is located at the across the lower back and radiates to RLE. The pain is described as aching and occurs all day. The symptoms have been progressive. Symptoms are exacerbated by exercise. MRI or CT showed HNP   Past Medical History  Diagnosis Date  . Diabetes mellitus without complication (HCC)     type 2  . Sleep apnea     occasionally uses CPAP  . GERD (gastroesophageal reflux disease)     takes nexium  . Headache     headaches and migraines  . Arthritis   . Fibromyalgia     takes cymbalta and gabapentin  . Low iron     hx of - no meds now  . Constipation   . PONV (postoperative nausea and vomiting)     Past Surgical History  Procedure Laterality Date  . Cholecystectomy    . Appendectomy    . Abdominal hysterectomy    . Colonoscopy    . Bilateral carpal tunnel release    . Trigger finger release Right     index finger    Prior to Admission medications   Medication Sig Start Date End Date Taking? Authorizing Provider  canagliflozin (INVOKANA) 300 MG TABS tablet Take 300 mg by mouth daily before breakfast.   Yes Historical Provider, MD  colesevelam (WELCHOL) 625 MG tablet Take 1,875 mg by mouth 2 (two) times daily with a meal.   Yes Historical Provider, MD  cyclobenzaprine (FLEXERIL) 10 MG tablet Take 10 mg by mouth 3 (three) times daily as needed for muscle spasms.   Yes Historical Provider, MD  diclofenac (VOLTAREN) 50 MG EC tablet Take 50 mg by mouth 2 (two) times daily.   Yes Historical Provider, MD  doxycycline (VIBRA-TABS) 100 MG tablet Take 100 mg by mouth 2 (two) times daily.   Yes Historical Provider, MD  DULoxetine (CYMBALTA) 60 MG capsule Take 60 mg by mouth daily.   Yes Historical Provider, MD  esomeprazole (NEXIUM) 40 MG capsule Take 40 mg by mouth daily at 12 noon.   Yes Historical Provider, MD   furosemide (LASIX) 40 MG tablet Take 40 mg by mouth.   Yes Historical Provider, MD  gabapentin (NEURONTIN) 400 MG capsule Take 800 mg by mouth 2 (two) times daily.   Yes Historical Provider, MD  HYDROcodone-homatropine (HYCODAN) 5-1.5 MG/5ML syrup Take 5 mLs by mouth every 6 (six) hours as needed for cough.   Yes Historical Provider, MD  insulin glargine (LANTUS) 100 UNIT/ML injection Inject 40 Units into the skin at bedtime.   Yes Historical Provider, MD  Liraglutide (VICTOZA) 18 MG/3ML SOPN Inject 18 mg into the skin at bedtime.   Yes Historical Provider, MD  loratadine (CLARITIN) 10 MG tablet Take 10 mg by mouth daily.   Yes Historical Provider, MD  metFORMIN (GLUCOPHAGE) 500 MG tablet Take 500 mg by mouth 3 (three) times daily.   Yes Historical Provider, MD  temazepam (RESTORIL) 30 MG capsule Take 30 mg by mouth at bedtime as needed for sleep.   Yes Historical Provider, MD  traMADol (ULTRAM) 50 MG tablet Take 50 mg by mouth every 8 (eight) hours as needed for moderate pain.   Yes Historical Provider, MD  vitamin B-12 (CYANOCOBALAMIN) 1000 MCG tablet Take 1,000 mcg by mouth daily.   Yes Historical Provider, MD  busPIRone (BUSPAR)  5 MG tablet Take 10 mg by mouth 2 (two) times daily.    Historical Provider, MD   Allergies  Allergen Reactions  . Cephalexin Rash and Nausea Only  . Statins Nausea And Vomiting, Rash and Other (See Comments)    Statins-hmg-coa Reductase Inhibitors (Statins)   INTOLERANCES: NAUSEA, MYALGIAS    Social History  Substance Use Topics  . Smoking status: Never Smoker   . Smokeless tobacco: Never Used  . Alcohol Use: Yes     Comment: maybe once a year    Family History  Problem Relation Age of Onset  . Cancer Mother   . Heart attack Father      Review of Systems  Positive ROS: neg  All other systems have been reviewed and were otherwise negative with the exception of those mentioned in the HPI and as above.  Objective: Vital signs in last 24  hours: Temp:  [98 F (36.7 C)] 98 F (36.7 C) (12/23 0906) Pulse Rate:  [75] 75 (12/23 0906) Resp:  [18] 18 (12/23 0906) BP: (131)/(71) 131/71 mmHg (12/23 0906) SpO2:  [97 %] 97 % (12/23 0906) Weight:  [99.899 kg (220 lb 3.8 oz)] 99.899 kg (220 lb 3.8 oz) (12/23 0906)  General Appearance: Alert, cooperative, no distress, appears stated age Head: Normocephalic, without obvious abnormality, atraumatic Eyes: PERRL, conjunctiva/corneas clear, EOM's intact    Neck: Supple, symmetrical, trachea midline Back: Symmetric, no curvature, ROM normal, no CVA tenderness Lungs:  respirations unlabored Heart: Regular rate and rhythm Abdomen: Soft, non-tender Extremities: Extremities normal, atraumatic, no cyanosis or edema Pulses: 2+ and symmetric all extremities Skin: Skin color, texture, turgor normal, no rashes or lesions  NEUROLOGIC:   Mental status: Alert and oriented x4,  no aphasia, good attention span, fund of knowledge, and memory Motor Exam - grossly normal Sensory Exam - grossly normal Reflexes: 1+ Coordination - grossly normal Gait - grossly normal Balance - grossly normal Cranial Nerves: I: smell Not tested  II: visual acuity  OS: nl    OD: nl  II: visual fields Full to confrontation  II: pupils Equal, round, reactive to light  III,VII: ptosis None  III,IV,VI: extraocular muscles  Full ROM  V: mastication Normal  V: facial light touch sensation  Normal  V,VII: corneal reflex  Present  VII: facial muscle function - upper  Normal  VII: facial muscle function - lower Normal  VIII: hearing Not tested  IX: soft palate elevation  Normal  IX,X: gag reflex Present  XI: trapezius strength  5/5  XI: sternocleidomastoid strength 5/5  XI: neck flexion strength  5/5  XII: tongue strength  Normal    Data Review Lab Results  Component Value Date   WBC 10.1 11/29/2015   HGB 14.5 11/29/2015   HCT 45.7 11/29/2015   MCV 93.8 11/29/2015   PLT 195 11/29/2015   Lab Results   Component Value Date   NA 141 11/29/2015   K 4.6 11/29/2015   CL 104 11/29/2015   CO2 26 11/29/2015   BUN 9 11/29/2015   CREATININE 0.75 11/29/2015   GLUCOSE 135* 11/29/2015   Lab Results  Component Value Date   INR 0.94 11/29/2015    Assessment/Plan: Patient admitted for R l3-4 diskectomy. Patient has failed a reasonable attempt at conservative therapy.  I explained the condition and procedure to the patient and answered any questions.  Patient wishes to proceed with procedure as planned. Understands risks/ benefits and typical outcomes of procedure.   Tanija Germani S 12/01/2015 12:12 PM

## 2015-12-01 NOTE — Anesthesia Procedure Notes (Signed)
Procedure Name: Intubation Date/Time: 12/01/2015 12:27 PM Performed by: Little Ishikawa L Pre-anesthesia Checklist: Patient identified, Timeout performed, Emergency Drugs available, Suction available and Patient being monitored Patient Re-evaluated:Patient Re-evaluated prior to inductionOxygen Delivery Method: Circle system utilized Preoxygenation: Pre-oxygenation with 100% oxygen Intubation Type: IV induction Ventilation: Mask ventilation without difficulty Laryngoscope Size: Mac and 3 Grade View: Grade I Tube type: Oral Tube size: 7.0 mm Number of attempts: 2 Airway Equipment and Method: Stylet Placement Confirmation: ETT inserted through vocal cords under direct vision,  positive ETCO2 and breath sounds checked- equal and bilateral Secured at: 21 cm Tube secured with: Tape Dental Injury: Teeth and Oropharynx as per pre-operative assessment

## 2015-12-01 NOTE — Op Note (Signed)
12/01/2015  1:45 PM  PATIENT:  Gabrielle Esparza  59 y.o. female  PRE-OPERATIVE DIAGNOSIS:  Right L3-4 extraforaminal disc herniation  POST-OPERATIVE DIAGNOSIS:  same  PROCEDURE:  Right L3-4 extraforaminal microdiscectomy utilizing microscopic dissection  SURGEON:  Marikay Alar, MD  ASSISTANTS: none  ANESTHESIA:   General  EBL: <25 ml  Total I/O In: 1200 [I.V.:1200] Out: -   BLOOD ADMINISTERED:none  DRAINS: none   SPECIMEN:  No Specimen  INDICATION FOR PROCEDURE: This patient present with severe right leg pain in L3 distribution. MRI showed a large L3-4 extra foraminal disc protrusion on the right. She tried medical management without relief. I recommended right L3-4 extra foraminal microdiscectomy. Patient understood the risks, benefits, and alternatives and potential outcomes and wished to proceed.  PROCEDURE DETAILS: The patient was taken to the operating room and after induction of adequate generalized endotracheal anesthesia, the patient was rolled into the prone position on the Wilson frame and all pressure points were padded. The lumbar region was cleaned and then prepped with DuraPrep and draped in the usual sterile fashion. 5 cc of local anesthesia was injected and then a dorsal midline incision was made and carried down to the lumbo sacral fascia. The fascia was opened and the paraspinous musculature was taken down in a subperiosteal fashion to expose the extra foraminal space at L3-4 on the right. Intraoperative x-ray confirmed my level, and then I used a combination of the high-speed drill and the Kerrison punches to perform an extraforaminal decompression at L3-4 on the right. I drilled away the superior part of the facet and lateral part of the pars. The underlying yellow ligament was opened and removed in a piecemeal fashion to expose the exiting nerve root. I saw the nerve root above and a very large disc herniation below so I coagulated the epidural venous vasculature,  and incised the disc space. ! performed a thorough intradiscal discectomy with pituitary rongeurs and curettes, until I had a nice decompression of the nerve root.  I then palpated with a coronary dilator along the nerve root and into the foramen to assure adequate decompression. I felt no more compression of the nerve root. I irrigated with saline solution containing bacitracin. Achieved hemostasis with bipolar cautery, lined the dura with Gelfoam, and then closed the fascia with 0 Vicryl. I closed the subcutaneous tissues with 2-0 Vicryl and the subcuticular tissues with 3-0 Vicryl. The skin was then closed with benzoin and Steri-Strips. The drapes were removed, a sterile dressing was applied. The patient was awakened from general anesthesia and transferred to the recovery room in stable condition. At the end of the procedure all sponge, needle and instrument counts were correct.   PLAN OF CARE: Admit for overnight observation  PATIENT DISPOSITION:  PACU - hemodynamically stable.   Delay start of Pharmacological VTE agent (>24hrs) due to surgical blood loss or risk of bleeding:  yes

## 2015-12-01 NOTE — Progress Notes (Signed)
Discharge orders received. Pt for discharge home today. IV D/C'd. Dressing CDI to lower back. Prescription and D/C instructions given with verbalized understanding. Family at bedside to assist with D/C. RN transported Pt downstairs via wheelchair for D/C.

## 2015-12-02 NOTE — Anesthesia Postprocedure Evaluation (Signed)
Anesthesia Post Note  Patient: Gabrielle Esparza  Procedure(s) Performed: Procedure(s) (LRB):  extraforaminal Microdiscectomy  - Right Lumbar three-lumbar four  (Right)  Patient location during evaluation: PACU Anesthesia Type: General Level of consciousness: awake and alert, oriented, patient cooperative and awake Pain management: pain level controlled Vital Signs Assessment: post-procedure vital signs reviewed and stable Respiratory status: spontaneous breathing, nonlabored ventilation, respiratory function stable and patient connected to nasal cannula oxygen Cardiovascular status: blood pressure returned to baseline and stable Postop Assessment: no signs of nausea or vomiting Anesthetic complications: no    Last Vitals:  Filed Vitals:   12/01/15 1457 12/01/15 1738  BP: 129/57 124/57  Pulse:  85  Temp: 36.8 C 36.9 C  Resp:  20    Last Pain:  Filed Vitals:   12/01/15 2005  PainSc: 8                  Cecile Hearing

## 2015-12-05 ENCOUNTER — Encounter (HOSPITAL_COMMUNITY): Payer: Self-pay | Admitting: Neurological Surgery

## 2016-02-09 ENCOUNTER — Other Ambulatory Visit: Payer: Self-pay | Admitting: Neurological Surgery

## 2016-02-09 DIAGNOSIS — M79604 Pain in right leg: Secondary | ICD-10-CM

## 2016-02-19 ENCOUNTER — Inpatient Hospital Stay: Admission: RE | Admit: 2016-02-19 | Payer: Medicaid Other | Source: Ambulatory Visit

## 2016-02-20 ENCOUNTER — Other Ambulatory Visit: Payer: Self-pay | Admitting: Neurological Surgery

## 2016-03-08 ENCOUNTER — Encounter (HOSPITAL_COMMUNITY)
Admission: RE | Admit: 2016-03-08 | Discharge: 2016-03-08 | Disposition: A | Discharge status: Home / Self Care | Payer: Medicaid Other | Source: Ambulatory Visit | Attending: Neurological Surgery | Admitting: Neurological Surgery

## 2016-03-08 ENCOUNTER — Encounter (HOSPITAL_COMMUNITY): Payer: Self-pay

## 2016-03-08 DIAGNOSIS — Z0183 Encounter for blood typing: Secondary | ICD-10-CM | POA: Insufficient documentation

## 2016-03-08 DIAGNOSIS — Z01818 Encounter for other preprocedural examination: Secondary | ICD-10-CM | POA: Diagnosis not present

## 2016-03-08 DIAGNOSIS — M5126 Other intervertebral disc displacement, lumbar region: Secondary | ICD-10-CM | POA: Insufficient documentation

## 2016-03-08 DIAGNOSIS — Z01812 Encounter for preprocedural laboratory examination: Secondary | ICD-10-CM | POA: Diagnosis not present

## 2016-03-08 DIAGNOSIS — E119 Type 2 diabetes mellitus without complications: Secondary | ICD-10-CM | POA: Insufficient documentation

## 2016-03-08 DIAGNOSIS — G4733 Obstructive sleep apnea (adult) (pediatric): Secondary | ICD-10-CM | POA: Insufficient documentation

## 2016-03-08 DIAGNOSIS — Z79899 Other long term (current) drug therapy: Secondary | ICD-10-CM | POA: Insufficient documentation

## 2016-03-08 DIAGNOSIS — M797 Fibromyalgia: Secondary | ICD-10-CM | POA: Diagnosis not present

## 2016-03-08 DIAGNOSIS — K219 Gastro-esophageal reflux disease without esophagitis: Secondary | ICD-10-CM | POA: Diagnosis not present

## 2016-03-08 DIAGNOSIS — Z7984 Long term (current) use of oral hypoglycemic drugs: Secondary | ICD-10-CM | POA: Insufficient documentation

## 2016-03-08 HISTORY — DX: Anxiety disorder, unspecified: F41.9

## 2016-03-08 LAB — CBC WITH DIFFERENTIAL/PLATELET
BASOS ABS: 0 10*3/uL (ref 0.0–0.1)
Basophils Relative: 0 %
Eosinophils Absolute: 1 10*3/uL — ABNORMAL HIGH (ref 0.0–0.7)
Eosinophils Relative: 7 %
HEMATOCRIT: 47.5 % — AB (ref 36.0–46.0)
Hemoglobin: 16.5 g/dL — ABNORMAL HIGH (ref 12.0–15.0)
LYMPHS PCT: 35 %
Lymphs Abs: 5.2 10*3/uL — ABNORMAL HIGH (ref 0.7–4.0)
MCH: 31.6 pg (ref 26.0–34.0)
MCHC: 34.7 g/dL (ref 30.0–36.0)
MCV: 91 fL (ref 78.0–100.0)
Monocytes Absolute: 0.8 10*3/uL (ref 0.1–1.0)
Monocytes Relative: 5 %
NEUTROS ABS: 7.8 10*3/uL — AB (ref 1.7–7.7)
Neutrophils Relative %: 53 %
Platelets: 270 10*3/uL (ref 150–400)
RBC: 5.22 MIL/uL — AB (ref 3.87–5.11)
RDW: 14.4 % (ref 11.5–15.5)
WBC: 14.9 10*3/uL — AB (ref 4.0–10.5)

## 2016-03-08 LAB — TYPE AND SCREEN
ABO/RH(D): O POS
ANTIBODY SCREEN: NEGATIVE

## 2016-03-08 LAB — BASIC METABOLIC PANEL
ANION GAP: 12 (ref 5–15)
BUN: <5 mg/dL — ABNORMAL LOW (ref 6–20)
CALCIUM: 9.6 mg/dL (ref 8.9–10.3)
CO2: 27 mmol/L (ref 22–32)
Chloride: 102 mmol/L (ref 101–111)
Creatinine, Ser: 0.86 mg/dL (ref 0.44–1.00)
GLUCOSE: 116 mg/dL — AB (ref 65–99)
POTASSIUM: 3.8 mmol/L (ref 3.5–5.1)
Sodium: 141 mmol/L (ref 135–145)

## 2016-03-08 LAB — GLUCOSE, CAPILLARY: GLUCOSE-CAPILLARY: 147 mg/dL — AB (ref 65–99)

## 2016-03-08 LAB — SURGICAL PCR SCREEN
MRSA, PCR: NEGATIVE
STAPHYLOCOCCUS AUREUS: NEGATIVE

## 2016-03-08 LAB — PROTIME-INR
INR: 0.95 (ref 0.00–1.49)
Prothrombin Time: 12.9 seconds (ref 11.6–15.2)

## 2016-03-08 LAB — ABO/RH: ABO/RH(D): O POS

## 2016-03-08 NOTE — Pre-Procedure Instructions (Signed)
Gabrielle Esparza  03/08/2016      [id number] - [place], [place] - [address] [place] [address] Phone: [phone] Fax: [phone]    Your procedure is scheduled on 4/5/2017[facility].  Report to [facility] Admitting at 10:15 A.M.  Call this number if you have problems the morning of surgery:  [phone]   Remember:  Do not eat food or drink liquids after midnight.  Take these medicines the morning of surgery with A SIP OF WATER : CYMBALTA, NEXIUM, GABAPENTIN, CLARITIN, (Pain medicine is ok if needed) & BUSPAR- if needed               STOP DIFLUFENAC   Do not wear jewelry, make-up or nail polish.   Do not wear lotions, powders, or perfumes.  You may wear deodorant.   Do not shave 48 hours prior to surgery.    Do not bring valuables to the hospital.   [facility] is not responsible for any belongings or valuables.  Contacts, dentures or bridgework may not be worn into surgery.  Leave your suitcase in the car.  After surgery it may be brought to your room.  For patients admitted to the hospital, discharge time will be determined by your treatment team.  Patients discharged the day of surgery will not be allowed to drive home.   Name and phone number of your driver:   With spouse  Special instructions: Special Instructions: [name] - Preparing for Surgery  Before surgery, you can play an important role.  Because skin is not sterile, your skin needs to be as free of germs as possible.  You can reduce the number of germs on you skin by washing with CHG (chlorahexidine gluconate) soap before surgery.  CHG is an antiseptic cleaner which kills germs and bonds with the skin to continue killing germs even after washing.  Please DO NOT use if you have an allergy to CHG or antibacterial soaps.  If your skin becomes reddened/irritated stop using the CHG and inform your nurse when you arrive at Short Stay.  Do  not shave (including legs and underarms) for at least 48 hours prior to the first CHG shower.  You may shave your face.  Please follow these instructions carefully:   1.  Shower with CHG Soap the night before surgery and the  morning of Surgery.  2.  If you choose to wash your hair, wash your hair first as usual with your  normal shampoo.  3.  After you shampoo, rinse your hair and body thoroughly to remove the  Shampoo.  4.  Use CHG as you would any other liquid soap.  You can apply chg directly to the skin and wash gently with scrungie or a clean washcloth.  5.  Apply the CHG Soap to your body ONLY FROM THE NECK DOWN.    Do not use on open wounds or open sores.  Avoid contact with your eyes, ears, mouth and genitals (private parts).  Wash genitals (private parts)   with your normal soap.  6.  Wash thoroughly, paying special attention to the area where your surgery will be performed.  7.  Thoroughly rinse your body with warm water from the neck down.  8.  DO NOT shower/wash with your normal soap after using and rinsing off   the CHG Soap.  9.  Pat yourself dry with a clean towel.  10.  Wear clean pajamas.            11.  Place clean sheets on your bed the night of your first shower and do not sleep with pets.  Day of Surgery  Do not apply any lotions/deodorants the morning of surgery.  Please wear clean clothes to the hospital/surgery center.   How to Manage Your Diabetes Before and After Surgery  Why is it important to control my blood sugar before and after surgery? . Improving blood sugar levels before and after surgery helps healing and can limit problems. . A way of improving blood sugar control is eating a healthy diet by: o  Eating less sugar and carbohydrates o  Increasing activity/exercise o  Talking with your doctor about reaching your blood sugar goals . High blood sugars (greater than 180 mg/dL) can raise your risk of infections and slow your recovery, so you will  need to focus on controlling your diabetes during the weeks before surgery. . Make sure that the doctor who takes care of your diabetes knows about your planned surgery including the date and location.  How do I manage my blood sugar before surgery? . Check your blood sugar at least 4 times a day, starting 2 days before surgery, to make sure that the level is not too high or low. o Check your blood sugar the morning of your surgery when you wake up and every 2 hours until you get to the Short Stay unit. . If your blood sugar is less than 70 mg/dL, you will need to treat for low blood sugar: o Do not take insulin. o Treat a low blood sugar (less than 70 mg/dL) with  cup of clear juice (cranberry or apple), 4 glucose tablets, OR glucose gel. o Recheck blood sugar in 15 minutes after treatment (to make sure it is greater than 70 mg/dL). If your blood sugar is not greater than 70 mg/dL on recheck, call [phone] for further instructions. . Report your blood sugar to the short stay nurse when you get to Short Stay.  . If you are admitted to the hospital after surgery: o Your blood sugar will be checked by the staff and you will probably be given insulin after surgery (instead of oral diabetes medicines) to make sure you have good blood sugar levels. o The goal for blood sugar control after surgery is 80-180 mg/dL.              WHAT DO I DO ABOUT MY DIABETES MEDICATION?   Marland Kitchen Do not take oral diabetes medicines (pills) the morning of surgery.  . THE NIGHT BEFORE SURGERY, take _____32______ units of ___LANTUS________insulin. Marland Kitchen USE THE REGULAR DOSE of VICTOZA the night before surgery        . THE MORNING OF SURGERY,- NO METFORMIN, NO INVOKANA  . The day of surgery, do not take other diabetes injectables, including  Victoza (liraglutide)  . If your CBG is greater than 220 mg/dL, you may take  of your sliding scale (correction) dose of insulin.  Other  Instructions:          Patient Signature:  Date:   Nurse Signature:  Date:   Reviewed and Endorsed by [facility] Patient Education Committee, August 2015  Please read over the following fact sheets that you were given. Pain Booklet, Coughing and Deep Breathing, Blood Transfusion Information, MRSA Information and Surgical Site Infection Prevention

## 2016-03-08 NOTE — Progress Notes (Signed)
Pt. Denies ever having cardiac advanced studies.  Pt. Followed by Dr. At [facility] for pain management . EKG done & reviewed at the time of her last surgery 3+ months ago by our anesth. Dept.

## 2016-03-09 LAB — HEMOGLOBIN A1C
HEMOGLOBIN A1C: 6.5 % — AB (ref 4.8–5.6)
Mean Plasma Glucose: 140 mg/dL

## 2016-03-11 NOTE — Progress Notes (Addendum)
Anesthesia Chart Review: Patient is a 60 year old female scheduled for right L3-4 transforaminal lumbar interbody fusion on 03/13/16 extra microdiscectomy by Dr. Yetta Barre.  History includes non-smoker, post-op N/V, DM2, OSA (occasional CPAP use), GERD, migraines, arthritis, fibromyalgia (Dr. Rosario Jacks), iron deficiency, cholecystectomy, hysterectomy, appendectomy, s/p L3-4 microdiscectomy 12/01/15. BMI is consistent with obesity.   PCP is Dr. Brooke Bonito Neurologist is Dr. Rosario Jacks.   Meds include Buspar, Invokana, Welchol, Flexeril, Voltaren, Cymbalta, Nexium, Lasix, Neurontin, Lantus, Victoza, Claritin, metformin, Percocet, temazepam, Topamax, tramadol.   PAT Vitals: BP 136/84, HR 107, RR 18, T 36.7C, O2 sat 98%. CBG 147.  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 EKG previously reviewed with anesthesiologist Dr. Desmond Lope prior to her 11/2015 surgery. I also got a comparison tracing from 2010 from [facility] which showed poor anterior r wave progression and consider inferior infarct which I think is overall similar to her tracing from 11/29/15 (tracaing scanned under Media tab, Correspondence 12/01/15).   11/29/15 CXR: IMPRESSION: No active disease.  Preoperative labs noted. A1C is 6.5. WBC 14.9K. H/H 16.5/47.5. Cr 0.86. PT/INR WNL. Elevated WBC called to Erie Noe at Dr. Yetta Barre' office. No acute sick symptoms documented from her PAT visit. Will defer decision for any follow-up labs to Dr. Yetta Barre.   She tolerated surgery approximately three months ago. She was slightly tachycardic at PAT (HR not rechecked then). She will get vitals on arrival. If felt stable and no acute sick symptoms or other significant changes then I would anticipate that she could proceed as planned.  Velna Ochs [facility] Short Stay Center/Anesthesiology Phone [phone] 03/12/2016 10:48 AM

## 2016-03-12 MED ORDER — VANCOMYCIN HCL IN DEXTROSE 1-5 GM/200ML-% IV SOLN
1000.0000 mg | INTRAVENOUS | Status: AC
  Administered 2016-03-13: 1000 mg via INTRAVENOUS
  Filled 2016-03-12: qty 200

## 2016-03-12 MED ORDER — DEXAMETHASONE SODIUM PHOSPHATE 10 MG/ML IJ SOLN
10.0000 mg | INTRAMUSCULAR | Status: AC
  Administered 2016-03-13: 10 mg via INTRAVENOUS
  Filled 2016-03-12: qty 1

## 2016-03-13 ENCOUNTER — Inpatient Hospital Stay (HOSPITAL_COMMUNITY): Payer: Medicaid Other | Admitting: Vascular Surgery

## 2016-03-13 ENCOUNTER — Inpatient Hospital Stay (HOSPITAL_COMMUNITY): Payer: Medicaid Other

## 2016-03-13 ENCOUNTER — Inpatient Hospital Stay (HOSPITAL_COMMUNITY)
Admission: RE | Admit: 2016-03-13 | Discharge: 2016-03-15 | DRG: 460 | Disposition: A | Discharge status: Home / Self Care | Payer: Medicaid Other | Source: Ambulatory Visit | Attending: Neurological Surgery | Admitting: Neurological Surgery

## 2016-03-13 ENCOUNTER — Encounter (HOSPITAL_COMMUNITY): Payer: Self-pay | Admitting: *Deleted

## 2016-03-13 ENCOUNTER — Encounter (HOSPITAL_COMMUNITY)
Admission: RE | Disposition: A | Discharge status: Home / Self Care | Payer: Self-pay | Source: Ambulatory Visit | Attending: Neurological Surgery

## 2016-03-13 ENCOUNTER — Inpatient Hospital Stay (HOSPITAL_COMMUNITY): Payer: Medicaid Other | Admitting: Certified Registered Nurse Anesthetist

## 2016-03-13 DIAGNOSIS — Z7984 Long term (current) use of oral hypoglycemic drugs: Secondary | ICD-10-CM | POA: Diagnosis not present

## 2016-03-13 DIAGNOSIS — Z79899 Other long term (current) drug therapy: Secondary | ICD-10-CM

## 2016-03-13 DIAGNOSIS — M5126 Other intervertebral disc displacement, lumbar region: Secondary | ICD-10-CM | POA: Diagnosis present

## 2016-03-13 DIAGNOSIS — Z794 Long term (current) use of insulin: Secondary | ICD-10-CM | POA: Diagnosis not present

## 2016-03-13 DIAGNOSIS — M797 Fibromyalgia: Secondary | ICD-10-CM | POA: Diagnosis present

## 2016-03-13 DIAGNOSIS — M1990 Unspecified osteoarthritis, unspecified site: Secondary | ICD-10-CM | POA: Diagnosis present

## 2016-03-13 DIAGNOSIS — G4730 Sleep apnea, unspecified: Secondary | ICD-10-CM | POA: Diagnosis present

## 2016-03-13 DIAGNOSIS — F419 Anxiety disorder, unspecified: Secondary | ICD-10-CM | POA: Diagnosis present

## 2016-03-13 DIAGNOSIS — E119 Type 2 diabetes mellitus without complications: Secondary | ICD-10-CM | POA: Diagnosis present

## 2016-03-13 DIAGNOSIS — K219 Gastro-esophageal reflux disease without esophagitis: Secondary | ICD-10-CM | POA: Diagnosis present

## 2016-03-13 DIAGNOSIS — Z981 Arthrodesis status: Secondary | ICD-10-CM

## 2016-03-13 DIAGNOSIS — M532X6 Spinal instabilities, lumbar region: Secondary | ICD-10-CM | POA: Diagnosis present

## 2016-03-13 DIAGNOSIS — M545 Low back pain: Secondary | ICD-10-CM | POA: Diagnosis present

## 2016-03-13 DIAGNOSIS — Z419 Encounter for procedure for purposes other than remedying health state, unspecified: Secondary | ICD-10-CM

## 2016-03-13 HISTORY — PX: TRANSFORAMINAL LUMBAR INTERBODY FUSION (TLIF) WITH PEDICLE SCREW FIXATION 1 LEVEL: SHX6141

## 2016-03-13 LAB — GLUCOSE, CAPILLARY
GLUCOSE-CAPILLARY: 194 mg/dL — AB (ref 65–99)
Glucose-Capillary: 86 mg/dL (ref 65–99)
Glucose-Capillary: 137 mg/dL — ABNORMAL HIGH (ref 65–99)

## 2016-03-13 SURGERY — TRANSFORAMINAL LUMBAR INTERBODY FUSION (TLIF) WITH PEDICLE SCREW FIXATION 1 LEVEL
Anesthesia: General | Site: Back | Laterality: Right

## 2016-03-13 MED ORDER — LACTATED RINGERS IV SOLN
INTRAVENOUS | Status: DC
  Administered 2016-03-13 (×2): via INTRAVENOUS

## 2016-03-13 MED ORDER — 0.9 % SODIUM CHLORIDE (POUR BTL) OPTIME
TOPICAL | Status: DC | PRN
  Administered 2016-03-13: 1000 mL

## 2016-03-13 MED ORDER — OXYCODONE HCL 5 MG PO TABS
5.0000 mg | ORAL_TABLET | Freq: Four times a day (QID) | ORAL | Status: DC | PRN
  Administered 2016-03-14: 5 mg via ORAL
  Administered 2016-03-14: 10 mg via ORAL
  Administered 2016-03-14: 5 mg via ORAL
  Filled 2016-03-13: qty 1
  Filled 2016-03-13: qty 2
  Filled 2016-03-13: qty 1

## 2016-03-13 MED ORDER — OXYCODONE-ACETAMINOPHEN 5-325 MG PO TABS
1.0000 | ORAL_TABLET | Freq: Four times a day (QID) | ORAL | Status: DC | PRN
  Administered 2016-03-13 – 2016-03-14 (×2): 2 via ORAL
  Administered 2016-03-14: 1 via ORAL
  Administered 2016-03-14: 2 via ORAL
  Filled 2016-03-13 (×2): qty 2
  Filled 2016-03-13: qty 1
  Filled 2016-03-13: qty 2

## 2016-03-13 MED ORDER — SCOPOLAMINE 1 MG/3DAYS TD PT72
MEDICATED_PATCH | TRANSDERMAL | Status: AC
  Filled 2016-03-13: qty 1

## 2016-03-13 MED ORDER — TEMAZEPAM 15 MG PO CAPS
30.0000 mg | ORAL_CAPSULE | Freq: Every evening | ORAL | Status: DC | PRN
  Administered 2016-03-13 – 2016-03-14 (×2): 30 mg via ORAL
  Filled 2016-03-13 (×2): qty 2

## 2016-03-13 MED ORDER — DULOXETINE HCL 60 MG PO CPEP
60.0000 mg | ORAL_CAPSULE | Freq: Every day | ORAL | Status: DC
  Administered 2016-03-14 – 2016-03-15 (×2): 60 mg via ORAL
  Filled 2016-03-13 (×2): qty 1

## 2016-03-13 MED ORDER — MIDAZOLAM HCL 2 MG/2ML IJ SOLN
INTRAMUSCULAR | Status: AC
  Filled 2016-03-13: qty 2

## 2016-03-13 MED ORDER — MIDAZOLAM HCL 5 MG/5ML IJ SOLN
INTRAMUSCULAR | Status: DC | PRN
  Administered 2016-03-13: 2 mg via INTRAVENOUS

## 2016-03-13 MED ORDER — PANTOPRAZOLE SODIUM 40 MG PO TBEC
40.0000 mg | DELAYED_RELEASE_TABLET | Freq: Every day | ORAL | Status: DC
  Administered 2016-03-14 – 2016-03-15 (×2): 40 mg via ORAL
  Filled 2016-03-13 (×2): qty 1

## 2016-03-13 MED ORDER — THROMBIN 5000 UNITS EX SOLR
OROMUCOSAL | Status: DC | PRN
  Administered 2016-03-13: 13:00:00 via TOPICAL

## 2016-03-13 MED ORDER — ONDANSETRON HCL 4 MG/2ML IJ SOLN
INTRAMUSCULAR | Status: AC
  Filled 2016-03-13: qty 2

## 2016-03-13 MED ORDER — MENTHOL 3 MG MT LOZG: 1.0000 | LOZENGE | OROMUCOSAL | Status: DC | PRN

## 2016-03-13 MED ORDER — PROPOFOL 500 MG/50ML IV EMUL
INTRAVENOUS | Status: DC | PRN
  Administered 2016-03-13: 50 ug/kg/min via INTRAVENOUS

## 2016-03-13 MED ORDER — CYCLOBENZAPRINE HCL 10 MG PO TABS
10.0000 mg | ORAL_TABLET | Freq: Three times a day (TID) | ORAL | Status: DC | PRN
  Administered 2016-03-13 – 2016-03-15 (×4): 10 mg via ORAL
  Filled 2016-03-13 (×3): qty 1

## 2016-03-13 MED ORDER — SODIUM CHLORIDE 0.9% FLUSH: 3.0000 mL | INTRAVENOUS | Status: DC | PRN

## 2016-03-13 MED ORDER — PROPOFOL 10 MG/ML IV BOLUS
INTRAVENOUS | Status: AC
  Filled 2016-03-13: qty 20

## 2016-03-13 MED ORDER — OXYCODONE HCL 5 MG/5ML PO SOLN
5.0000 mg | Freq: Once | ORAL | Status: AC | PRN
Start: 2016-03-13 — End: 2016-03-13

## 2016-03-13 MED ORDER — SODIUM CHLORIDE 0.9% FLUSH
3.0000 mL | Freq: Two times a day (BID) | INTRAVENOUS | Status: DC
  Administered 2016-03-13 – 2016-03-14 (×3): 3 mL via INTRAVENOUS

## 2016-03-13 MED ORDER — VANCOMYCIN HCL 1000 MG IV SOLR
INTRAVENOUS | Status: AC
  Filled 2016-03-13: qty 1000

## 2016-03-13 MED ORDER — LIRAGLUTIDE 18 MG/3ML ~~LOC~~ SOPN: 1.8000 mg | PEN_INJECTOR | Freq: Every day | SUBCUTANEOUS | Status: DC

## 2016-03-13 MED ORDER — PHENOL 1.4 % MT LIQD: 1.0000 | OROMUCOSAL | Status: DC | PRN

## 2016-03-13 MED ORDER — SODIUM CHLORIDE 0.9 % IV SOLN
250.0000 mL | INTRAVENOUS | Status: DC
  Administered 2016-03-14: 250 mL via INTRAVENOUS

## 2016-03-13 MED ORDER — TOPIRAMATE 25 MG PO TABS
25.0000 mg | ORAL_TABLET | Freq: Two times a day (BID) | ORAL | Status: DC
  Administered 2016-03-13 – 2016-03-15 (×4): 25 mg via ORAL
  Filled 2016-03-13 (×4): qty 1

## 2016-03-13 MED ORDER — ACETAMINOPHEN 650 MG RE SUPP: 650.0000 mg | RECTAL | Status: DC | PRN

## 2016-03-13 MED ORDER — HYDROMORPHONE HCL 1 MG/ML IJ SOLN
INTRAMUSCULAR | Status: AC
  Filled 2016-03-13: qty 1

## 2016-03-13 MED ORDER — THROMBIN 20000 UNITS EX SOLR
CUTANEOUS | Status: DC | PRN
  Administered 2016-03-13: 13:00:00 via TOPICAL

## 2016-03-13 MED ORDER — METFORMIN HCL 500 MG PO TABS
500.0000 mg | ORAL_TABLET | Freq: Three times a day (TID) | ORAL | Status: DC
  Administered 2016-03-13 – 2016-03-15 (×6): 500 mg via ORAL
  Filled 2016-03-13 (×6): qty 1

## 2016-03-13 MED ORDER — FUROSEMIDE 40 MG PO TABS
40.0000 mg | ORAL_TABLET | Freq: Every day | ORAL | Status: DC
  Administered 2016-03-13 – 2016-03-15 (×3): 40 mg via ORAL
  Filled 2016-03-13 (×3): qty 1

## 2016-03-13 MED ORDER — FENTANYL CITRATE (PF) 250 MCG/5ML IJ SOLN
INTRAMUSCULAR | Status: AC
  Filled 2016-03-13: qty 5

## 2016-03-13 MED ORDER — MORPHINE SULFATE (PF) 2 MG/ML IV SOLN
1.0000 mg | INTRAVENOUS | Status: DC | PRN
  Administered 2016-03-13 – 2016-03-15 (×6): 2 mg via INTRAVENOUS
  Filled 2016-03-13 (×6): qty 1

## 2016-03-13 MED ORDER — CYCLOBENZAPRINE HCL 10 MG PO TABS
ORAL_TABLET | ORAL | Status: AC
  Filled 2016-03-13: qty 1

## 2016-03-13 MED ORDER — CELECOXIB 200 MG PO CAPS
200.0000 mg | ORAL_CAPSULE | Freq: Two times a day (BID) | ORAL | Status: DC
  Administered 2016-03-13 – 2016-03-15 (×4): 200 mg via ORAL
  Filled 2016-03-13 (×4): qty 1

## 2016-03-13 MED ORDER — LIDOCAINE HCL (CARDIAC) 20 MG/ML IV SOLN
INTRAVENOUS | Status: AC
  Filled 2016-03-13: qty 5

## 2016-03-13 MED ORDER — PROPOFOL 10 MG/ML IV BOLUS
INTRAVENOUS | Status: DC | PRN
  Administered 2016-03-13: 180 mg via INTRAVENOUS

## 2016-03-13 MED ORDER — SUCCINYLCHOLINE CHLORIDE 20 MG/ML IJ SOLN
INTRAMUSCULAR | Status: DC | PRN
  Administered 2016-03-13: 120 mg via INTRAVENOUS

## 2016-03-13 MED ORDER — ACETAMINOPHEN 325 MG PO TABS: 650.0000 mg | ORAL_TABLET | ORAL | Status: DC | PRN

## 2016-03-13 MED ORDER — VANCOMYCIN HCL 1000 MG IV SOLR
INTRAVENOUS | Status: DC | PRN
  Administered 2016-03-13: 1000 mg

## 2016-03-13 MED ORDER — GABAPENTIN 400 MG PO CAPS
800.0000 mg | ORAL_CAPSULE | Freq: Two times a day (BID) | ORAL | Status: DC
  Administered 2016-03-13 – 2016-03-15 (×4): 800 mg via ORAL
  Filled 2016-03-13 (×4): qty 2

## 2016-03-13 MED ORDER — ONDANSETRON HCL 4 MG/2ML IJ SOLN: 4.0000 mg | INTRAMUSCULAR | Status: DC | PRN

## 2016-03-13 MED ORDER — OXYCODONE HCL 5 MG PO TABS
ORAL_TABLET | ORAL | Status: AC
  Filled 2016-03-13: qty 1

## 2016-03-13 MED ORDER — OXYCODONE HCL 5 MG PO TABS
5.0000 mg | ORAL_TABLET | Freq: Once | ORAL | Status: AC | PRN
  Administered 2016-03-13: 5 mg via ORAL

## 2016-03-13 MED ORDER — FENTANYL CITRATE (PF) 100 MCG/2ML IJ SOLN
INTRAMUSCULAR | Status: DC | PRN
  Administered 2016-03-13: 25 ug via INTRAVENOUS
  Administered 2016-03-13 (×2): 50 ug via INTRAVENOUS
  Administered 2016-03-13: 25 ug via INTRAVENOUS
  Administered 2016-03-13: 100 ug via INTRAVENOUS

## 2016-03-13 MED ORDER — ONDANSETRON HCL 4 MG/2ML IJ SOLN: 4.0000 mg | Freq: Four times a day (QID) | INTRAMUSCULAR | Status: DC | PRN

## 2016-03-13 MED ORDER — POTASSIUM CHLORIDE IN NACL 20-0.9 MEQ/L-% IV SOLN
INTRAVENOUS | Status: DC
  Administered 2016-03-13: 18:00:00 via INTRAVENOUS
  Filled 2016-03-13 (×5): qty 1000

## 2016-03-13 MED ORDER — BUPIVACAINE HCL (PF) 0.25 % IJ SOLN
INTRAMUSCULAR | Status: DC | PRN
  Administered 2016-03-13: 4 mL

## 2016-03-13 MED ORDER — PHENYLEPHRINE HCL 10 MG/ML IJ SOLN
INTRAMUSCULAR | Status: DC | PRN
  Administered 2016-03-13 (×3): 80 ug via INTRAVENOUS
  Administered 2016-03-13: 120 ug via INTRAVENOUS

## 2016-03-13 MED ORDER — SCOPOLAMINE 1 MG/3DAYS TD PT72
1.0000 | MEDICATED_PATCH | TRANSDERMAL | Status: DC
  Administered 2016-03-13: 1.5 mg via TRANSDERMAL

## 2016-03-13 MED ORDER — OXYCODONE-ACETAMINOPHEN 10-325 MG PO TABS: 1.0000 | ORAL_TABLET | Freq: Four times a day (QID) | ORAL | Status: DC | PRN

## 2016-03-13 MED ORDER — CANAGLIFLOZIN 300 MG PO TABS
300.0000 mg | ORAL_TABLET | Freq: Every day | ORAL | Status: DC
  Administered 2016-03-14 – 2016-03-15 (×2): 300 mg via ORAL
  Filled 2016-03-13 (×2): qty 1

## 2016-03-13 MED ORDER — VANCOMYCIN HCL IN DEXTROSE 1-5 GM/200ML-% IV SOLN
1000.0000 mg | Freq: Two times a day (BID) | INTRAVENOUS | Status: DC
  Administered 2016-03-13 – 2016-03-15 (×4): 1000 mg via INTRAVENOUS
  Filled 2016-03-13 (×6): qty 200

## 2016-03-13 MED ORDER — LIDOCAINE HCL (CARDIAC) 20 MG/ML IV SOLN
INTRAVENOUS | Status: DC | PRN
  Administered 2016-03-13: 60 mg via INTRAVENOUS

## 2016-03-13 MED ORDER — HYDROMORPHONE HCL 1 MG/ML IJ SOLN
0.2500 mg | INTRAMUSCULAR | Status: DC | PRN
  Administered 2016-03-13: 0.25 mg via INTRAVENOUS
  Administered 2016-03-13 (×3): 0.5 mg via INTRAVENOUS
  Administered 2016-03-13: 0.25 mg via INTRAVENOUS

## 2016-03-13 MED ORDER — BUSPIRONE HCL 10 MG PO TABS: 5.0000 mg | ORAL_TABLET | Freq: Two times a day (BID) | ORAL | Status: DC | PRN

## 2016-03-13 MED ORDER — SODIUM CHLORIDE 0.9 % IR SOLN
Status: DC | PRN
  Administered 2016-03-13: 13:00:00

## 2016-03-13 SURGICAL SUPPLY — 66 items
BAG DECANTER FOR FLEXI CONT (MISCELLANEOUS) ×3 IMPLANT
BENZOIN TINCTURE PRP APPL 2/3 (GAUZE/BANDAGES/DRESSINGS) ×3 IMPLANT
BIT DRILL PLIF MAS 5.0MM DISP (DRILL) ×1 IMPLANT
BLADE CLIPPER SURG (BLADE) IMPLANT
BONE MATRIX OSTEOCEL PRO MED (Bone Implant) ×3 IMPLANT
BUR MATCHSTICK NEURO 3.0 LAGG (BURR) ×3 IMPLANT
CAGE COROENT TLIF PEEK 10X9X30 (Cage) ×3 IMPLANT
CANISTER SUCT 3000ML PPV (MISCELLANEOUS) ×3 IMPLANT
CLIP NEUROVISION LG (CLIP) ×6 IMPLANT
CLOSURE WOUND 1/2 X4 (GAUZE/BANDAGES/DRESSINGS) ×1
CONT SPEC 4OZ CLIKSEAL STRL BL (MISCELLANEOUS) ×3 IMPLANT
COVER BACK TABLE 60X90IN (DRAPES) ×3 IMPLANT
DERMABOND ADVANCED (GAUZE/BANDAGES/DRESSINGS) ×2
DERMABOND ADVANCED .7 DNX12 (GAUZE/BANDAGES/DRESSINGS) ×1 IMPLANT
DRAPE C-ARM 42X72 X-RAY (DRAPES) ×3 IMPLANT
DRAPE C-ARMOR (DRAPES) ×3 IMPLANT
DRAPE LAPAROTOMY 100X72X124 (DRAPES) ×3 IMPLANT
DRAPE POUCH INSTRU U-SHP 10X18 (DRAPES) ×3 IMPLANT
DRAPE SURG 17X23 STRL (DRAPES) ×3 IMPLANT
DRILL PLIF MAS 5.0MM DISP (DRILL) ×3
DRSG OPSITE 4X5.5 SM (GAUZE/BANDAGES/DRESSINGS) ×3 IMPLANT
DRSG OPSITE POSTOP 4X6 (GAUZE/BANDAGES/DRESSINGS) ×3 IMPLANT
DRSG TELFA 3X8 NADH (GAUZE/BANDAGES/DRESSINGS) ×3 IMPLANT
DURAPREP 26ML APPLICATOR (WOUND CARE) ×3 IMPLANT
ELECT REM PT RETURN 9FT ADLT (ELECTROSURGICAL) ×3
ELECTRODE REM PT RTRN 9FT ADLT (ELECTROSURGICAL) ×1 IMPLANT
EVACUATOR 1/8 PVC DRAIN (DRAIN) ×6 IMPLANT
GAUZE SPONGE 4X4 16PLY XRAY LF (GAUZE/BANDAGES/DRESSINGS) IMPLANT
GLOVE BIO SURGEON STRL SZ8 (GLOVE) ×6 IMPLANT
GLOVE BIOGEL PI IND STRL 7.0 (GLOVE) ×1 IMPLANT
GLOVE BIOGEL PI IND STRL 7.5 (GLOVE) ×1 IMPLANT
GLOVE BIOGEL PI INDICATOR 7.0 (GLOVE) ×2
GLOVE BIOGEL PI INDICATOR 7.5 (GLOVE) ×2
GOWN STRL REUS W/ TWL LRG LVL3 (GOWN DISPOSABLE) IMPLANT
GOWN STRL REUS W/ TWL XL LVL3 (GOWN DISPOSABLE) ×2 IMPLANT
GOWN STRL REUS W/TWL 2XL LVL3 (GOWN DISPOSABLE) IMPLANT
GOWN STRL REUS W/TWL LRG LVL3 (GOWN DISPOSABLE)
GOWN STRL REUS W/TWL XL LVL3 (GOWN DISPOSABLE) ×4
HEMOSTAT POWDER KIT SURGIFOAM (HEMOSTASIS) IMPLANT
HEMOSTAT POWDER SURGIFOAM 1G (HEMOSTASIS) ×3 IMPLANT
KIT BASIN OR (CUSTOM PROCEDURE TRAY) ×3 IMPLANT
KIT INFUSE XX SMALL 0.7CC (Orthopedic Implant) ×3 IMPLANT
KIT ROOM TURNOVER OR (KITS) ×3 IMPLANT
MILL MEDIUM DISP (BLADE) IMPLANT
MODULE NVM5 NEXT GEN EMG (NEEDLE) ×3 IMPLANT
NEEDLE HYPO 25X1 1.5 SAFETY (NEEDLE) ×3 IMPLANT
NS IRRIG 1000ML POUR BTL (IV SOLUTION) ×3 IMPLANT
PACK LAMINECTOMY NEURO (CUSTOM PROCEDURE TRAY) ×3 IMPLANT
PAD ARMBOARD 7.5X6 YLW CONV (MISCELLANEOUS) ×9 IMPLANT
ROD PREBENT 45MM LUMBAR (Rod) ×6 IMPLANT
SCREW LOCK (Screw) ×8 IMPLANT
SCREW LOCK FXNS SPNE MAS PL (Screw) ×4 IMPLANT
SCREW SHANK 5.0X35 (Screw) ×12 IMPLANT
SCREW TULIP 5.5 (Screw) ×12 IMPLANT
SPONGE LAP 4X18 X RAY DECT (DISPOSABLE) IMPLANT
SPONGE SURGIFOAM ABS GEL 100 (HEMOSTASIS) ×3 IMPLANT
STRIP CLOSURE SKIN 1/2X4 (GAUZE/BANDAGES/DRESSINGS) ×2 IMPLANT
SUT VIC AB 0 CT1 18XCR BRD8 (SUTURE) ×1 IMPLANT
SUT VIC AB 0 CT1 8-18 (SUTURE) ×2
SUT VIC AB 2-0 CP2 18 (SUTURE) ×3 IMPLANT
SUT VIC AB 3-0 SH 8-18 (SUTURE) ×6 IMPLANT
TOWEL OR 17X24 6PK STRL BLUE (TOWEL DISPOSABLE) ×3 IMPLANT
TOWEL OR 17X26 10 PK STRL BLUE (TOWEL DISPOSABLE) ×3 IMPLANT
TRAP SPECIMEN MUCOUS 40CC (MISCELLANEOUS) IMPLANT
TRAY FOLEY W/METER SILVER 14FR (SET/KITS/TRAYS/PACK) ×3 IMPLANT
WATER STERILE IRR 1000ML POUR (IV SOLUTION) ×3 IMPLANT

## 2016-03-13 NOTE — Anesthesia Postprocedure Evaluation (Signed)
Anesthesia Post Note  Patient: Gabrielle Esparza  Procedure(s) Performed: Procedure(s) (LRB): Right LUMBAR THREE- FOUR TRANSFORAMINAL LUMBAR INTERBODY FUSION (TLIF) WITH PEDICLE SCREW FIXATION  (Right)  Patient location during evaluation: PACU Anesthesia Type: General Level of consciousness: awake and alert and patient cooperative Pain management: pain level controlled Vital Signs Assessment: post-procedure vital signs reviewed and stable Respiratory status: spontaneous breathing and respiratory function stable Cardiovascular status: stable Anesthetic complications: no    Last Vitals:  Filed Vitals:   03/13/16 1600 03/13/16 1615  BP: 128/66 123/66  Pulse: 95 97  Temp:    Resp: 16 16    Last Pain:  Filed Vitals:   03/13/16 1617  PainSc: 8                  Page Lancon S

## 2016-03-13 NOTE — Transfer of Care (Signed)
Immediate Anesthesia Transfer of Care Note  Patient: Gabrielle Esparza  Procedure(s) Performed: Procedure(s): Right LUMBAR THREE- FOUR TRANSFORAMINAL LUMBAR INTERBODY FUSION (TLIF) WITH PEDICLE SCREW FIXATION  (Right)  Patient Location: PACU  Anesthesia Type:General  Level of Consciousness: awake, alert  and oriented  Airway & Oxygen Therapy: Patient Spontanous Breathing and Patient connected to face mask oxygen  Post-op Assessment: Report given to RN and Post -op Vital signs reviewed and stable  Post vital signs: Reviewed and stable  Last Vitals:  Filed Vitals:   03/13/16 1030  BP: 125/63  Pulse: 91  Temp: 36.9 C  Resp: 18    Complications: No apparent anesthesia complications

## 2016-03-13 NOTE — Op Note (Signed)
03/13/2016  2:49 PM  PATIENT:  Gabrielle Esparza  60 y.o. 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 Decompressive lumbar laminectomy L3-4 requiring more work than would be required for a simple exposure of the disk for PLIF in order to adequately decompress the neural elements and address the spinal stenosis 2. Right transforaminal lumbar interbody fusion L3-4 using PEEK interbody cage packed with morcellized allograft and autograft 3. Posterior fixation L3-4 using cortical pedicle screws.    SURGEON:  Marikay Alar, MD  ASSISTANTS: Dr. Mikal Plane  ANESTHESIA:  General  EBL: 300 ml  Total I/O In: 1300 [I.V.:1300] Out: 350 [Urine:50; Blood:300]  BLOOD ADMINISTERED:none  DRAINS: Hemovac   INDICATION FOR PROCEDURE: This patient underwent a right L3 for extra foraminal microdiscectomy weeks ago. She presented with severe pain. She had significant discogenic change in the disc space and a recurrent disc protrusion. I felt like it was probably underlying instability. Also think surgery for recurrent extra foraminal disc protrusions are difficult and risky injury to the neural elements. Therefore recommended decompression and fusion. Patient understood the risks, benefits, and alternatives and potential outcomes and wished to proceed.  PROCEDURE DETAILS:  The patient was brought to the operating room. After induction of generalized endotracheal anesthesia the patient was rolled into the prone position on chest rolls and all pressure points were padded. The patient's lumbar region was cleaned and then prepped with DuraPrep and draped in the usual sterile fashion. Anesthesia was injected and then a dorsal midline incision was made and carried down to the lumbosacral fascia. The fascia was opened and the paraspinous musculature was taken down in a subperiosteal fashion to expose L3-4. A self-retaining  retractor was placed. Intraoperative fluoroscopy confirmed my level, and I started with placement of the L3 and L4 cortical pedicle screws. The pedicle screw entry zones were identified utilizing surface landmarks and  AP and lateral fluoroscopy. I scored the cortex with the high-speed drill and then used the hand drill and EMG monitoring to drill an upward and outward direction into the pedicle. I then tapped line to line, and the tap was also monitored. I then placed a 5-0 x 35 mm cortical pedicle screw into the pedicles of L3 and L4 bilaterally. I then turned my attention to the decompression and the spinous process was removed and complete lumbar laminectomies, hemi- facetectomies, and foraminotomies were performed at L3-4. There was also significant instability of the facets and loosening of the interspinous ligament. The patient had significant spinal stenosis and this required more work than would be required for a simple exposure of the disc for posterior lumbar interbody fusion. Much more generous decompression was undertaken in order to adequately decompress the neural elements and address the patient's leg pain. The yellow ligament was removed to expose the underlying dura and nerve roots, and generous foraminotomies were performed to adequately decompress the neural elements. Both the exiting and traversing nerve roots were decompressed on both sides until a coronary dilator passed easily along the nerve roots. Once the decompression was complete, I turned my attention to the posterior lower lumbar interbody fusion. The epidural venous vasculature was coagulated and cut sharply. Disc space was incised and the initial discectomy was performed with pituitary rongeurs. The disc space was distracted with sequential distractors to a height of 10 mm. We then used a series of scrapers and shavers to prepare the endplates for fusion. The midline was  prepared with Epstein curettes. Once the complete discectomy  was finished, we packed an appropriate sized peek interbody cage with local autograft and morcellized allograft, gently retracted the nerve root, and tapped the cage into position at L3-4 from the right.  Posterior to the cage was packed with morselized autograft and allograft. . We then placed lordotic rods into the multiaxial screw heads of the pedicle screws and locked these in position with the locking caps and anti-torque device. We then checked our construct with AP and lateral fluoroscopy. Irrigated with copious amounts of bacitracin-containing saline solution. Placed a medium Hemovac drain through separate stab incision. Inspected the nerve roots once again to assure adequate decompression, lined to the dura with Gelfoam, and closed the muscle and the fascia with 0 Vicryl. Closed the subcutaneous tissues with 2-0 Vicryl and subcuticular tissues with 3-0 Vicryl. The skin was closed with benzoin and Steri-Strips. Dressing was then applied, the patient was awakened from general anesthesia and transported to the recovery room in stable condition. At the end of the procedure all sponge, needle and instrument counts were correct.   PLAN OF CARE: Admit to inpatient   PATIENT DISPOSITION:  PACU - hemodynamically stable.   Delay start of Pharmacological VTE agent (>24hrs) due to surgical blood loss or risk of bleeding:  yes

## 2016-03-13 NOTE — Progress Notes (Signed)
Patient arrived to room from PACU at this time, alert, denied pain . Safety precautions and orders reviewed with patient. ICS encourage. FOLEY d/c'ed per order. Will continue to monitor.  Sim Boast, RN

## 2016-03-13 NOTE — Anesthesia Procedure Notes (Signed)
Procedure Name: Intubation Date/Time: 03/13/2016 11:51 AM Performed by: Dairl Ponder Pre-anesthesia Checklist: Patient identified, Emergency Drugs available, Suction available and Patient being monitored Patient Re-evaluated:Patient Re-evaluated prior to inductionOxygen Delivery Method: Circle System Utilized Preoxygenation: Pre-oxygenation with 100% oxygen Intubation Type: IV induction Ventilation: Mask ventilation without difficulty Laryngoscope Size: Mac and 3 Grade View: Grade II Tube type: Oral Tube size: 7.0 mm Number of attempts: 1 Airway Equipment and Method: Stylet and Oral airway Placement Confirmation: ETT inserted through vocal cords under direct vision,  positive ETCO2 and breath sounds checked- equal and bilateral Secured at: 22 cm Tube secured with: Tape Dental Injury: Teeth and Oropharynx as per pre-operative assessment

## 2016-03-13 NOTE — Anesthesia Preprocedure Evaluation (Signed)
Anesthesia Evaluation  Patient identified by MRN, date of birth, ID band Patient awake    Reviewed: Allergy & Precautions, NPO status , Patient's Chart, lab work & pertinent test results  History of Anesthesia Complications (+) PONV  Airway Mallampati: II   Neck ROM: full    Dental   Pulmonary sleep apnea ,    breath sounds clear to auscultation       Cardiovascular negative cardio ROS   Rhythm:regular Rate:Normal     Neuro/Psych  Headaches, Anxiety  Neuromuscular disease    GI/Hepatic GERD  ,  Endo/Other  diabetes, Type 2obese  Renal/GU      Musculoskeletal  (+) Arthritis , Fibromyalgia -  Abdominal   Peds  Hematology   Anesthesia Other Findings   Reproductive/Obstetrics                             Anesthesia Physical Anesthesia Plan  ASA: II  Anesthesia Plan: General   Post-op Pain Management:    Induction: Intravenous  Airway Management Planned: Oral ETT  Additional Equipment:   Intra-op Plan:   Post-operative Plan: Extubation in OR  Informed Consent: I have reviewed the patients History and Physical, chart, labs and discussed the procedure including the risks, benefits and alternatives for the proposed anesthesia with the patient or authorized representative who has indicated his/her understanding and acceptance.     Plan Discussed with: CRNA, Anesthesiologist and Surgeon  Anesthesia Plan Comments:         Anesthesia Quick Evaluation

## 2016-03-13 NOTE — Progress Notes (Signed)
Due to positioning and area of surgical incision, sacral foam prophylaxis not applied.

## 2016-03-13 NOTE — H&P (Signed)
Subjective: Patient is a 60 y.o. female admitted for TLIF l3-4. Onset of symptoms was several months ago, rapidly worsening since that time.  The pain is rated intense, unremitting, and is located at the across the lower back and radiates to leg. The pain is described as aching and occurs all day. The symptoms have been progressive. Symptoms are exacerbated by exercise. MRI or CT showed recurrent HNP l3-4.   Past Medical History  Diagnosis Date  . Sleep apnea     occasionally uses CPAP  . GERD (gastroesophageal reflux disease)     takes nexium  . Headache     headaches and migraines  . Fibromyalgia     takes cymbalta and gabapentin  . Low iron     hx of - no meds now  . Constipation   . PONV (postoperative nausea and vomiting)   . Diabetes mellitus without complication (HCC)     type 2  . Anxiety   . Arthritis     OA,- hands (RA)& back      Past Surgical History  Procedure Laterality Date  . Cholecystectomy    . Appendectomy    . Abdominal hysterectomy    . Colonoscopy    . Bilateral carpal tunnel release    . Trigger finger release Right     index finger  . Lumbar laminectomy/decompression microdiscectomy Right 12/01/2015    Procedure:  extraforaminal Microdiscectomy  - Right Lumbar three-lumbar four ;  Surgeon: Tia Alert, MD;  Location: [facility];  Service: Neurosurgery;  Laterality: Right;  . Back surgery      lumbar decompression   . Bladder suspension      Prior to Admission medications   Medication Sig Start Date End Date Taking? Authorizing Provider  busPIRone (BUSPAR) 5 MG tablet Take 5 mg by mouth 2 (two) times daily as needed.    Yes Historical Provider, MD  canagliflozin (INVOKANA) 300 MG TABS tablet Take 300 mg by mouth daily before breakfast.   Yes Historical Provider, MD  colesevelam (WELCHOL) 625 MG tablet Take 1,875 mg by mouth 2 (two) times daily with a meal.   Yes Historical Provider, MD  cyclobenzaprine (FLEXERIL) 10 MG tablet Take 10 mg by mouth  3 (three) times daily as needed for muscle spasms.   Yes Historical Provider, MD  diclofenac (VOLTAREN) 50 MG EC tablet Take 50 mg by mouth 2 (two) times daily.   Yes Historical Provider, MD  DULoxetine (CYMBALTA) 60 MG capsule Take 60 mg by mouth daily before breakfast.    Yes Historical Provider, MD  esomeprazole (NEXIUM) 40 MG capsule Take 40 mg by mouth daily before breakfast.    Yes Historical Provider, MD  furosemide (LASIX) 40 MG tablet Take 40 mg by mouth daily.    Yes Historical Provider, MD  gabapentin (NEURONTIN) 400 MG capsule Take 800 mg by mouth 2 (two) times daily.   Yes Historical Provider, MD  insulin glargine (LANTUS) 100 UNIT/ML injection Inject 40 Units into the skin at bedtime.   Yes Historical Provider, MD  Liraglutide (VICTOZA) 18 MG/3ML SOPN Inject 1.8 mg into the skin at bedtime.    Yes Historical Provider, MD  loratadine (CLARITIN) 10 MG tablet Take 10 mg by mouth daily.   Yes Historical Provider, MD  metFORMIN (GLUCOPHAGE) 500 MG tablet Take 500 mg by mouth 3 (three) times daily.   Yes Historical Provider, MD  oxyCODONE-acetaminophen (PERCOCET) 10-325 MG tablet Take 1-2 tablets by mouth every 6 (six) hours as  needed for pain.   Yes Historical Provider, MD  temazepam (RESTORIL) 30 MG capsule Take 30 mg by mouth at bedtime.    Yes Historical Provider, MD  topiramate (TOPAMAX) 25 MG tablet Take 25 mg by mouth 2 (two) times daily.   Yes Historical Provider, MD  traMADol (ULTRAM) 50 MG tablet Take 50 mg by mouth every 8 (eight) hours as needed for moderate pain.   Yes Historical Provider, MD   Allergies  Allergen Reactions  . Statins Nausea And Vomiting, Rash and Other (See Comments)    MYALGIAS  . Cephalexin Nausea Only and Rash  . Rosuvastatin Calcium Nausea And Vomiting and Rash    Statins-hmg-coa Reductase Inhibitors (Statins) MYALGIAS    Social History  Substance Use Topics  . Smoking status: Never Smoker   . Smokeless tobacco: Never Used  . Alcohol Use: Yes      Comment: maybe once a year    Family History  Problem Relation Age of Onset  . Cancer Mother   . Heart attack Father      Review of Systems  Positive ROS: neg  All other systems have been reviewed and were otherwise negative with the exception of those mentioned in the HPI and as above.  Objective: Vital signs in last 24 hours: Temp:  [98.4 F (36.9 C)] 98.4 F (36.9 C) (04/05 1030) Pulse Rate:  [91] 91 (04/05 1030) Resp:  [18] 18 (04/05 1030) BP: (125)/(63) 125/63 mmHg (04/05 1030) SpO2:  [95 %] 95 % (04/05 1030) Weight:  [96.758 kg (213 lb 5 oz)] 96.758 kg (213 lb 5 oz) (04/05 1031)  General Appearance: Alert, cooperative, no distress, appears stated age Head: Normocephalic, without obvious abnormality, atraumatic Eyes: PERRL, conjunctiva/corneas clear, EOM's intact    Neck: Supple, symmetrical, trachea midline Back: Symmetric, no curvature, ROM normal, no CVA tenderness Lungs:  respirations unlabored Heart: Regular rate and rhythm Abdomen: Soft, non-tender Extremities: Extremities normal, atraumatic, no cyanosis or edema Pulses: 2+ and symmetric all extremities Skin: Skin color, texture, turgor normal, no rashes or lesions  NEUROLOGIC:   Mental status: Alert and oriented x4,  no aphasia, good attention span, fund of knowledge, and memory Motor Exam - grossly normal Sensory Exam - grossly normal Reflexes: 1+ Coordination - grossly normal Gait - grossly normal Balance - grossly normal Cranial Nerves: I: smell Not tested  II: visual acuity  OS: nl    OD: nl  II: visual fields Full to confrontation  II: pupils Equal, round, reactive to light  III,VII: ptosis None  III,IV,VI: extraocular muscles  Full ROM  V: mastication Normal  V: facial light touch sensation  Normal  V,VII: corneal reflex  Present  VII: facial muscle function - upper  Normal  VII: facial muscle function - lower Normal  VIII: hearing Not tested  IX: soft palate elevation  Normal  IX,X:  gag reflex Present  XI: trapezius strength  5/5  XI: sternocleidomastoid strength 5/5  XI: neck flexion strength  5/5  XII: tongue strength  Normal    Data Review Lab Results  Component Value Date   WBC 14.9* 03/08/2016   HGB 16.5* 03/08/2016   HCT 47.5* 03/08/2016   MCV 91.0 03/08/2016   PLT 270 03/08/2016   Lab Results  Component Value Date   NA 141 03/08/2016   K 3.8 03/08/2016   CL 102 03/08/2016   CO2 27 03/08/2016   BUN <5* 03/08/2016   CREATININE 0.86 03/08/2016   GLUCOSE 116* 03/08/2016   Lab  Results  Component Value Date   INR 0.95 03/08/2016    Assessment/Plan: Patient admitted for TLIF L3-4 . Patient has failed a reasonable attempt at conservative therapy.  I explained the condition and procedure to the patient and answered any questions.  Patient wishes to proceed with procedure as planned. Understands risks/ benefits and typical outcomes of procedure.   Dudley Cooley S 03/13/2016 11:13 AM

## 2016-03-13 NOTE — Progress Notes (Signed)
Pharmacy Antibiotic Note Gabrielle Esparza is a 61 y.o. female admitted on 03/13/2016 who is s/p decompressive lumbar laminectomy L3-4 with hemovac drain in place.   Pharmacy has been consulted for vancomycin dosing for surgical prophylaxis in setting of cephalosporin allergy.  Plan: 1. Begin vancomycin 1 gram q12 hours this pm  2. BMP for 4/6 am 3. F/u on removal of drain and stop vancomycin  Height: 5\' 4"  (162.6 cm) Weight: 213 lb 5 oz (96.758 kg) IBW/kg (Calculated) : 54.7  Temp (24hrs), Avg:97.9 F (36.6 C), Min:97.2 F (36.2 C), Max:98.4 F (36.9 C)   Recent Labs Lab 03/08/16 1426  WBC 14.9*  CREATININE 0.86    Estimated Creatinine Clearance: 80.5 mL/min (by C-G formula based on Cr of 0.86).    Allergies  Allergen Reactions  . Statins Nausea And Vomiting, Rash and Other (See Comments)    MYALGIAS  . Cephalexin Nausea Only and Rash  . Rosuvastatin Calcium Nausea And Vomiting and Rash    Statins-hmg-coa Reductase Inhibitors (Statins) MYALGIAS    Antimicrobials this admission: 4/5 vancomycin >>   Dose adjustments this admission: n/a  Microbiology results: n/a  Thank you for allowing pharmacy to be a part of this patient's care.  03/10/16 03/13/2016 4:53 PM

## 2016-03-14 ENCOUNTER — Encounter (HOSPITAL_COMMUNITY): Payer: Self-pay | Admitting: Neurological Surgery

## 2016-03-14 LAB — GLUCOSE, CAPILLARY
GLUCOSE-CAPILLARY: 137 mg/dL — AB (ref 65–99)
GLUCOSE-CAPILLARY: 126 mg/dL — AB (ref 65–99)
GLUCOSE-CAPILLARY: 152 mg/dL — AB (ref 65–99)
Glucose-Capillary: 162 mg/dL — ABNORMAL HIGH (ref 65–99)

## 2016-03-14 LAB — BASIC METABOLIC PANEL
Anion gap: 9 (ref 5–15)
BUN: 10 mg/dL (ref 6–20)
CHLORIDE: 105 mmol/L (ref 101–111)
CO2: 26 mmol/L (ref 22–32)
Calcium: 8.3 mg/dL — ABNORMAL LOW (ref 8.9–10.3)
Creatinine, Ser: 0.69 mg/dL (ref 0.44–1.00)
GFR calc Af Amer: >60 mL/min (ref >60)
GFR calc non Af Amer: >60 mL/min (ref >60)
Glucose, Bld: 122 mg/dL — ABNORMAL HIGH (ref 65–99)
POTASSIUM: 4.1 mmol/L (ref 3.5–5.1)
SODIUM: 140 mmol/L (ref 135–145)

## 2016-03-14 MED ORDER — OXYCODONE-ACETAMINOPHEN 5-325 MG PO TABS
1.0000 | ORAL_TABLET | Freq: Four times a day (QID) | ORAL | Status: DC | PRN
  Administered 2016-03-14 – 2016-03-15 (×3): 2 via ORAL
  Filled 2016-03-14 (×3): qty 2

## 2016-03-14 MED ORDER — MAGNESIUM CITRATE PO SOLN
1.0000 | Freq: Once | ORAL | Status: AC
  Administered 2016-03-14: 1 via ORAL
  Filled 2016-03-14: qty 296

## 2016-03-14 MED ORDER — INSULIN ASPART 100 UNIT/ML ~~LOC~~ SOLN: 0.0000 [IU] | Freq: Every day | SUBCUTANEOUS | Status: DC

## 2016-03-14 MED ORDER — OXYCODONE HCL 5 MG PO TABS
5.0000 mg | ORAL_TABLET | ORAL | Status: DC | PRN
  Administered 2016-03-14: 10 mg via ORAL
  Administered 2016-03-14: 5 mg via ORAL
  Administered 2016-03-15: 10 mg via ORAL
  Filled 2016-03-14 (×2): qty 2
  Filled 2016-03-14: qty 1

## 2016-03-14 MED ORDER — INSULIN ASPART 100 UNIT/ML ~~LOC~~ SOLN
0.0000 [IU] | Freq: Three times a day (TID) | SUBCUTANEOUS | Status: DC
  Administered 2016-03-14: 3 [IU] via SUBCUTANEOUS
  Administered 2016-03-15: 2 [IU] via SUBCUTANEOUS
  Administered 2016-03-15: 5 [IU] via SUBCUTANEOUS

## 2016-03-14 NOTE — Evaluation (Signed)
Occupational Therapy Evaluation Patient Details Name: Gabrielle Esparza MRN: [id number] DOB: 11/22/1956 Today's Date: 03/14/2016    History of Present Illness s/p TLIF L3-4  PMHx- microdiscectomy L3-4 12/01/15; DM; fibromyalgia; anxiety   Clinical Impression   Pt reports she was independent with ADLs PTA. Currently pt requires min guard for safety with functional mobility and ADLs with the exception of mod assist for LB ADLs. Pt able to recall 2/3 back precautions but does a good job maintaining precautions during functional activities. Began safety, ADL, and back education with pt and husband. Pt planning to d/c home with 24/7 supervision from family. Pt would benefit from continued skilled OT to address established goals.    Follow Up Recommendations  No OT follow up;Supervision - Intermittent    Equipment Recommendations  3 in 1 bedside comode;Other (comment) (AE +toilet aide)    Recommendations for Other Services       Precautions / Restrictions Precautions Precautions: Back;Fall Precaution Booklet Issued: No Precaution Comments: Pt able to recall 2/3 back precautions. Reviewed all precautions with pt and husband. Required Braces or Orthoses: Spinal Brace Spinal Brace: Lumbar corset;Applied in sitting position Restrictions Weight Bearing Restrictions: No      Mobility Bed Mobility Overal bed mobility: Needs Assistance Bed Mobility: Rolling;Sidelying to Sit;Sit to Sidelying Rolling: Supervision Sidelying to sit: Supervision     Sit to sidelying: Supervision General bed mobility comments: HOB flat with no use of hand rail. Pt with good technique.  Transfers Overall transfer level: Needs assistance Equipment used: Rolling walker (2 wheeled) Transfers: Sit to/from Stand Sit to Stand: Min guard         General transfer comment: VCs for hand placement. Sit to stand from EOB x1, toilet x1.    Balance Overall balance assessment: Needs assistance Sitting-balance  support: Feet supported;No upper extremity supported Sitting balance-Leahy Scale: Good     Standing balance support: No upper extremity supported;During functional activity Standing balance-Leahy Scale: Good                              ADL Overall ADL's : Needs assistance/impaired Eating/Feeding: Set up;Sitting   Grooming: Min guard;Standing Grooming Details (indicate cue type and reason): Educated on use of 2 cups for oral care. Upper Body Bathing: Supervision/ safety;Sitting   Lower Body Bathing: Moderate assistance;Sit to/from stand   Upper Body Dressing : Set up;Supervision/safety;Sitting Upper Body Dressing Details (indicate cue type and reason): to don/doff back brace Lower Body Dressing: Moderate assistance;Sit to/from stand Lower Body Dressing Details (indicate cue type and reason): Pt unable to cross foot over opposite knee to don socks. Discussed use of AE for increased independence with LB ADLs; pt interested. Husband can assist as needed. Toilet Transfer: Min guard;Ambulation;Regular Toilet;Grab bars;RW   Toileting- Architect and Hygiene: Min guard;Sitting/lateral lean (for toilet hygiene) Toileting - Clothing Manipulation Details (indicate cue type and reason): Discussed use of toilet tongs for increased independence with peri care.   Tub/Shower Transfer Details (indicate cue type and reason): Educated pt and husband on use of 3 in 1 in tub as a seat and demo'ed technique; would like for pt to practice next session. Functional mobility during ADLs: Min guard;Rolling walker General ADL Comments: Educated pt and husband on maintaining precautions during functional activities, brace management and wear schedule, not sitting up for more than 61min-1hour at a time.     Vision     Perception     Praxis  Pertinent Vitals/Pain Pain Assessment: Faces Faces Pain Scale: Hurts little more Pain Location: back Pain Descriptors / Indicators:  Sore Pain Intervention(s): Limited activity within patient's tolerance;Monitored during session     Hand Dominance     Extremity/Trunk Assessment Upper Extremity Assessment Upper Extremity Assessment: Overall WFL for tasks assessed   Lower Extremity Assessment Lower Extremity Assessment: Defer to PT evaluation   Cervical / Trunk Assessment Cervical / Trunk Assessment: Other exceptions Cervical / Trunk Exceptions: post lumbar fusion   Communication Communication Communication: No difficulties   Cognition Arousal/Alertness: Awake/alert Behavior During Therapy: WFL for tasks assessed/performed Overall Cognitive Status: Within Functional Limits for tasks assessed       Memory: Decreased recall of precautions             General Comments       Exercises       Shoulder Instructions      Home Living Family/patient expects to be discharged to:: Private residence Living Arrangements: Spouse/significant other;Other relatives Available Help at Discharge: Family;Available 24 hours/day Type of Home: House Home Access: Stairs to enter [employer] of Steps: 2 Entrance Stairs-Rails: None Home Layout: One level     Bathroom Shower/Tub: Tub/shower unit;Curtain Shower/tub characteristics: Engineer, building services: Standard Bathroom Accessibility: Yes How Accessible: Accessible via walker Home Equipment: None          Prior Functioning/Environment Level of Independence: Needs assistance  Gait / Transfers Assistance Needed: was using husband's walker when it got bad; assist carrying groceries, picking up things from the floor ADL's / Homemaking Assistance Needed: independent        OT Diagnosis: Acute pain;Generalized weakness   OT Problem List: Impaired balance (sitting and/or standing);Decreased knowledge of use of DME or AE;Decreased knowledge of precautions;Obesity;Pain   OT Treatment/Interventions: Self-care/ADL training;Energy conservation;DME  and/or AE instruction;Patient/family education;Balance training    OT Goals(Current goals can be found in the care plan section) Acute Rehab OT Goals Patient Stated Goal: return home Sat/Sun OT Goal Formulation: With patient/family Time For Goal Achievement: 03/28/16 Potential to Achieve Goals: Good ADL Goals Pt Will Perform Lower Body Bathing: with adaptive equipment;sit to/from stand;with modified independence Pt Will Perform Lower Body Dressing: with modified independence;with adaptive equipment;sit to/from stand Pt Will Transfer to Toilet: with modified independence;ambulating;bedside commode (over toilet) Pt Will Perform Toileting - Clothing Manipulation and hygiene: with modified independence;sit to/from stand;with adaptive equipment Pt Will Perform Tub/Shower Transfer: Tub transfer;with supervision;ambulating;3 in 1;rolling walker Additional ADL Goal #1: Pt will independently don/doff back brace as precursor for ADLs and functional mobility. Additional ADL Goal #2: Pt will independently verbally recall 3/3 back precautions and maintain during ADL.  OT Frequency: Min 2X/week   Barriers to D/C:            Co-evaluation              End of Session Equipment Utilized During Treatment: Gait belt;Rolling walker;Back brace Nurse Communication: Other (comment) (does IV need to be reconnected?)  Activity Tolerance: Patient tolerated treatment well Patient left: in bed;with call bell/phone within reach;with bed alarm set;with family/visitor present   Time: 3235-5732 and 1322-1341  OT Time Calculation (min): 8 min + 19 min (First OT session interuped by RN/student RN's to re-establish IV access, returned for second OT session to complete eval) Charges:  OT General Charges $OT Visit: 1 Procedure OT Evaluation $OT Eval Moderate Complexity: 1 Procedure OT Treatments $Self Care/Home Management : 8-22 mins G-Codes:     Gaye Alken M.S., OTR/L Pager: [phone]  03/14/2016, 1:54  PM

## 2016-03-14 NOTE — Progress Notes (Signed)
Patient ID: Gabrielle Esparza, female   DOB: [birth year], 58 y.o.   MRN: [id number] Subjective: Patient reports appropriate soreness, no leg pain or NTW  Objective: Vital signs in last 24 hours: Temp:  [97.2 F (36.2 C)-98.4 F (36.9 C)] 98.4 F (36.9 C) (04/06 0524) Pulse Rate:  [64-108] 79 (04/06 0524) Resp:  [15-21] 18 (04/06 0524) BP: (98-128)/(45-66) 112/62 mmHg (04/06 0524) SpO2:  [92 %-98 %] 94 % (04/06 0524) Weight:  [96.758 kg (213 lb 5 oz)] 96.758 kg (213 lb 5 oz) (04/05 1031)  Intake/Output from previous day: 04/05 0701 - 04/06 0700 In: 1300 [I.V.:1300] Out: 770 [Urine:425; Drains:45; Blood:300] Intake/Output this shift:    Neurologic: Grossly normal  Lab Results: Lab Results  Component Value Date   WBC 14.9* 03/08/2016   HGB 16.5* 03/08/2016   HCT 47.5* 03/08/2016   MCV 91.0 03/08/2016   PLT 270 03/08/2016   Lab Results  Component Value Date   INR 0.95 03/08/2016   BMET Lab Results  Component Value Date   NA 141 03/08/2016   K 3.8 03/08/2016   CL 102 03/08/2016   CO2 27 03/08/2016   GLUCOSE 116* 03/08/2016   BUN <5* 03/08/2016   CREATININE 0.86 03/08/2016   CALCIUM 9.6 03/08/2016    Studies/Results: Dg Lumbar Spine 2-3 Views  03/13/2016  CLINICAL DATA:  L3-4  TLIF EXAM: LUMBAR SPINE - 2-3 VIEW; DG C-ARM 61-120 MIN COMPARISON:  None. FLUOROSCOPY TIME:  Radiation Exposure Index (as provided by the fluoroscopic device): Not available If the device does not provide the exposure index: Fluoroscopy Time:  1 minutes 8 seconds Number of Acquired Images:  2 FINDINGS: Interbody fusion is noted at L3-4 with bilateral pedicular screw placement and posterior fixation. IMPRESSION: Intraoperative localization for L3-4 fusion Electronically Signed   By: Alcide Clever M.D.   On: 03/13/2016 14:09   Dg C-arm 1-60 Min  03/13/2016  CLINICAL DATA:  L3-4  TLIF EXAM: LUMBAR SPINE - 2-3 VIEW; DG C-ARM 61-120 MIN COMPARISON:  None. FLUOROSCOPY TIME:  Radiation Exposure Index (as  provided by the fluoroscopic device): Not available If the device does not provide the exposure index: Fluoroscopy Time:  1 minutes 8 seconds Number of Acquired Images:  2 FINDINGS: Interbody fusion is noted at L3-4 with bilateral pedicular screw placement and posterior fixation. IMPRESSION: Intraoperative localization for L3-4 fusion Electronically Signed   By: Alcide Clever M.D.   On: 03/13/2016 14:09    Assessment/Plan: Seems to be doing ok Mobilize today  LOS: 1 day    Cristhian Vanhook S 03/14/2016, 7:28 AM

## 2016-03-14 NOTE — Care Management Note (Signed)
Case Management Note  Patient Details  Name: Gabrielle Esparza MRN: [id number] Date of Birth: 1956/08/12  Subjective/Objective:                    Action/Plan: Patient was admitted for a lumbar fusion. Lives at home with spouse. Will follow for discharge needs pending PT/OT evals and physician orders.   Expected Discharge Date:                  Expected Discharge Plan:     In-House Referral:     Discharge planning Services     Post Acute Care Choice:    Choice offered to:     DME Arranged:    DME Agency:     HH Arranged:    HH Agency:     Status of Service:  In process, will continue to follow  Medicare Important Message Given:    Date Medicare IM Given:    Medicare IM give by:    Date Additional Medicare IM Given:    Additional Medicare Important Message give by:     If discussed at Long Length of Stay Meetings, dates discussed:    Additional Comments:  Anda Kraft, RN 03/14/2016, 10:25 AM [phone]

## 2016-03-14 NOTE — Evaluation (Signed)
Physical Therapy Evaluation Patient Details Name: Gabrielle Esparza MRN: [id number] DOB: 1956/08/18 Today's Date: 03/14/2016   History of Present Illness  s/p TLIF L3-4  PMHx- microdiscectomy L3-4 12/01/15; DM; fibromyalgia; anxiety  Clinical Impression  Patient is s/p above surgery resulting in the deficits listed below (see PT Problem List).  Patient will benefit from skilled PT to increase their independence and safety with mobility (while adhering to their precautions) to allow discharge to the venue listed below.     Follow Up Recommendations No PT follow up;Supervision for mobility/OOB    Equipment Recommendations  Rolling walker with 5" wheels    Recommendations for Other Services       Precautions / Restrictions Precautions Precautions: Back;Fall Precaution Booklet Issued: Yes (comment) Precaution Comments: eduicate on precautions; she was not taught precautions after prior surgery Required Braces or Orthoses: Spinal Brace Spinal Brace: Lumbar corset;Applied in sitting position Restrictions Weight Bearing Restrictions: No      Mobility  Bed Mobility Overal bed mobility: Needs Assistance Bed Mobility: Rolling;Sidelying to Sit Rolling: Supervision Sidelying to sit: Min guard       General bed mobility comments: HOB flat with rail; vc for technique  Transfers Overall transfer level: Needs assistance Equipment used: Rolling walker (2 wheeled) Transfers: Sit to/from Stand Sit to Stand: Min assist         General transfer comment: pt "favors" RLE and shifts weight almost entirely onto LLE (due to pain); vc for safe use of DME; x 2 with more midline with armrests vs off EOB   Ambulation/Gait Ambulation/Gait assistance: Min assist Ambulation Distance (Feet): 100 Feet Assistive device: Rolling walker (2 wheeled) Gait Pattern/deviations: Step-through pattern;Decreased stride length;Decreased stance time - right;Decreased weight shift to right   Gait velocity  interpretation: Below normal speed for age/gender General Gait Details: Continues with some incr knee pain on RLE and very mild antalgic gait  Stairs            Wheelchair Mobility    Modified Rankin (Stroke Patients Only)       Balance Overall balance assessment: No apparent balance deficits (not formally assessed)                                           Pertinent Vitals/Pain Pain Assessment: 0-10 Pain Score: 6  Pain Location: back Pain Descriptors / Indicators: Aching Pain Intervention(s): Limited activity within patient's tolerance;Monitored during session;Premedicated before session;Repositioned    Home Living Family/patient expects to be discharged to:: Private residence Living Arrangements: Spouse/significant other;Other relatives (grandson 72 yo; daughter) Available Help at Discharge: Family;Available 24 hours/day Type of Home: House Home Access: Stairs to enter Entrance Stairs-Rails: None (has "seat-like" piece of [place] to hold onto) Entrance Stairs-Number of Steps: 2 Home Layout: One level Home Equipment: None      Prior Function Level of Independence: Needs assistance   Gait / Transfers Assistance Needed: was using husband's walker when it got bad; assist carrying groceries, picking up things from the floor  ADL's / Homemaking Assistance Needed: independent        Hand Dominance        Extremity/Trunk Assessment   Upper Extremity Assessment: Overall WFL for tasks assessed           Lower Extremity Assessment: RLE deficits/detail RLE Deficits / Details: AROM WFL; numbness PTA improved    Cervical / Trunk Assessment: Other exceptions  Communication   Communication: No difficulties  Cognition Arousal/Alertness: Awake/alert Behavior During Therapy: WFL for tasks assessed/performed Overall Cognitive Status: Within Functional Limits for tasks assessed                      General Comments      Exercises         Assessment/Plan    PT Assessment Patient needs continued PT services  PT Diagnosis Difficulty walking;Acute pain   PT Problem List Decreased mobility;Decreased knowledge of use of DME;Decreased knowledge of precautions;Impaired sensation;Pain;Obesity  PT Treatment Interventions DME instruction;Gait training;Stair training;Functional mobility training;Therapeutic activities;Patient/family education   PT Goals (Current goals can be found in the Care Plan section) Acute Rehab PT Goals Patient Stated Goal: return home Sat/Sun PT Goal Formulation: With patient Time For Goal Achievement: 03/10/16 Potential to Achieve Goals: Good    Frequency Min 5X/week   Barriers to discharge        Co-evaluation               End of Session Equipment Utilized During Treatment: Gait belt;Back brace Activity Tolerance: Patient tolerated treatment well Patient left: in chair;with call bell/phone within reach Nurse Communication: Mobility status         Time: 1856-3149 PT Time Calculation (min) (ACUTE ONLY): 39 min   Charges:   PT Evaluation $PT Eval Moderate Complexity: 1 Procedure PT Treatments $Gait Training: 23-37 mins   PT G Codes:        Gabrielle Esparza 03-31-2016, 9:37 AM Pager [phone]

## 2016-03-14 NOTE — Progress Notes (Signed)
OT Cancellation Note  Patient Details Name: Gabrielle Esparza MRN: [id number] DOB: 05-Jul-1956   Cancelled Treatment:    Reason Eval/Treat Not Completed: Other (comment) (pt currently with PT). Will follow up for OT eval as time allows.  Gaye Alken M.S., OTR/L Pager: [phone]  03/14/2016, 9:12 AM

## 2016-03-15 LAB — GLUCOSE, CAPILLARY
GLUCOSE-CAPILLARY: 137 mg/dL — AB (ref 65–99)
GLUCOSE-CAPILLARY: 206 mg/dL — AB (ref 65–99)

## 2016-03-15 MED ORDER — CYCLOBENZAPRINE HCL 10 MG PO TABS: 10.0000 mg | ORAL_TABLET | Freq: Three times a day (TID) | ORAL | Status: DC | PRN

## 2016-03-15 MED ORDER — OXYCODONE-ACETAMINOPHEN 10-325 MG PO TABS: 1.0000 | ORAL_TABLET | Freq: Four times a day (QID) | ORAL | Status: DC | PRN

## 2016-03-15 NOTE — Progress Notes (Signed)
Physical Therapy Treatment Patient Details Name: Gabrielle Esparza MRN: [id number] DOB: Mar 15, 1956 Today's Date: 03/15/2016    History of Present Illness s/p TLIF L3-4  PMHx- microdiscectomy L3-4 12/01/15; DM; fibromyalgia; anxiety    PT Comments    Patient mobilizing well with only 2 cues needed to prevent twisting; several cues for safe use of RW.   Follow Up Recommendations  No PT follow up     Equipment Recommendations  Rolling walker with 5" wheels    Recommendations for Other Services       Precautions / Restrictions Precautions Precautions: Back;Fall Precaution Comments: pt able to describe 2/3 with follow-up education provided Required Braces or Orthoses: Spinal Brace Spinal Brace: Lumbar corset;Applied in sitting position Restrictions Weight Bearing Restrictions: No    Mobility  Bed Mobility Overal bed mobility: Needs Assistance Bed Mobility: Rolling;Sidelying to Sit Rolling: Supervision Sidelying to sit: Min guard       General bed mobility comments: HOB flat, no rail; vc for technique as pt initiated with taking RLE off EOB while still supine  Transfers Overall transfer level: Needs assistance Equipment used: Rolling walker (2 wheeled) Transfers: Sit to/from Stand Sit to Stand: Min guard         General transfer comment: vc for safe use of RW; x3   Ambulation/Gait Ambulation/Gait assistance: Supervision Ambulation Distance (Feet): 180 Feet Assistive device: Rolling walker (2 wheeled) Gait Pattern/deviations: Step-through pattern;Decreased stride length   Gait velocity interpretation: Below normal speed for age/gender General Gait Details: Continues with some incr knee pain on RLE; more symmetrical weight shifting; reported increasing back pain with ambulation   Stairs Stairs:  (no, pt deferred; does not feel needs to practice prior to d/)          Wheelchair Mobility    Modified Rankin (Stroke Patients Only)       Balance Overall  balance assessment: Needs assistance         Standing balance support: No upper extremity supported Standing balance-Leahy Scale: Good Standing balance comment: washing hands at sink without difficulty                    Cognition Arousal/Alertness: Awake/alert Behavior During Therapy: WFL for tasks assessed/performed Overall Cognitive Status: Within Functional Limits for tasks assessed                      Exercises      General Comments        Pertinent Vitals/Pain Pain Assessment: 0-10 Pain Score: 8  Pain Location: back Pain Descriptors / Indicators: Operative site guarding Pain Intervention(s): Limited activity within patient's tolerance;Monitored during session;Premedicated before session;Repositioned    Home Living                      Prior Function            PT Goals (current goals can now be found in the care plan section) Acute Rehab PT Goals Patient Stated Goal: return home Sat/Sun Time For Goal Achievement: 03/21/16 Progress towards PT goals: Progressing toward goals    Frequency  Min 5X/week    PT Plan Current plan remains appropriate    Co-evaluation             End of Session Equipment Utilized During Treatment: Gait belt;Back brace Activity Tolerance: Patient tolerated treatment well Patient left: in chair;with call bell/phone within reach     Time: 9242-6834 PT Time Calculation (min) (ACUTE ONLY): 29  min  Charges:  $Gait Training: 23-37 mins                    G Codes:      Michaeljoseph Revolorio 03-18-16, 9:28 AM Pager [phone]

## 2016-03-15 NOTE — Discharge Summary (Signed)
Physician Discharge Summary  Patient ID: Gabrielle Esparza MRN: [id number] DOB/AGE: [birth year] 60 y.o.  Admit date: 03/13/2016 Discharge date: 03/15/2016  Admission Diagnoses: lumbar instability with recurrent HNP    Discharge Diagnoses: same   Discharged Condition: good  Hospital Course: The patient was admitted on 03/13/2016 and taken to the operating room where the patient underwent PLIF. The patient tolerated the procedure well and was taken to the recovery room and then to the floor in stable condition. The hospital course was routine. There were no complications. The wound remained clean dry and intact. Pt had appropriate back soreness. No complaints of leg pain or new N/T/W. The patient remained afebrile with stable vital signs, and tolerated a regular diet. The patient continued to increase activities, and pain was well controlled with oral pain medications.   Consults: None  Significant Diagnostic Studies:  Results for orders placed or performed during the hospital encounter of 03/13/16  Glucose, capillary  Result Value Ref Range   Glucose-Capillary 86 65 - 99 mg/dL  Glucose, capillary  Result Value Ref Range   Glucose-Capillary 137 (H) 65 - 99 mg/dL  Basic metabolic panel  Result Value Ref Range   Sodium 140 135 - 145 mmol/L   Potassium 4.1 3.5 - 5.1 mmol/L   Chloride 105 101 - 111 mmol/L   CO2 26 22 - 32 mmol/L   Glucose, Bld 122 (H) 65 - 99 mg/dL   BUN 10 6 - 20 mg/dL   Creatinine, Ser 2.40 0.44 - 1.00 mg/dL   Calcium 8.3 (L) 8.9 - 10.3 mg/dL   GFR calc non Af Amer >60 >60 mL/min   GFR calc Af Amer >60 >60 mL/min   Anion gap 9 5 - 15  Glucose, capillary  Result Value Ref Range   Glucose-Capillary 194 (H) 65 - 99 mg/dL   Comment 1 Notify RN    Comment 2 Document in Chart   Glucose, capillary  Result Value Ref Range   Glucose-Capillary 137 (H) 65 - 99 mg/dL   Comment 1 Notify RN    Comment 2 Document in Chart   Glucose, capillary  Result Value Ref Range   Glucose-Capillary 126 (H) 65 - 99 mg/dL   Comment 1 Notify RN    Comment 2 Document in Chart   Glucose, capillary  Result Value Ref Range   Glucose-Capillary 152 (H) 65 - 99 mg/dL  Glucose, capillary  Result Value Ref Range   Glucose-Capillary 162 (H) 65 - 99 mg/dL   Comment 1 Notify RN    Comment 2 Document in Chart   Glucose, capillary  Result Value Ref Range   Glucose-Capillary 137 (H) 65 - 99 mg/dL   Comment 1 Notify RN    Comment 2 Document in Chart   Glucose, capillary  Result Value Ref Range   Glucose-Capillary 206 (H) 65 - 99 mg/dL    Dg Lumbar Spine 2-3 Views  03/13/2016  CLINICAL DATA:  L3-4  TLIF EXAM: LUMBAR SPINE - 2-3 VIEW; DG C-ARM 61-120 MIN COMPARISON:  None. FLUOROSCOPY TIME:  Radiation Exposure Index (as provided by the fluoroscopic device): Not available If the device does not provide the exposure index: Fluoroscopy Time:  1 minutes 8 seconds Number of Acquired Images:  2 FINDINGS: Interbody fusion is noted at L3-4 with bilateral pedicular screw placement and posterior fixation. IMPRESSION: Intraoperative localization for L3-4 fusion Electronically Signed   By: Alcide Clever M.D.   On: 03/13/2016 14:09   Dg C-arm 1-60 Min  03/13/2016  CLINICAL DATA:  L3-4  TLIF EXAM: LUMBAR SPINE - 2-3 VIEW; DG C-ARM 61-120 MIN COMPARISON:  None. FLUOROSCOPY TIME:  Radiation Exposure Index (as provided by the fluoroscopic device): Not available If the device does not provide the exposure index: Fluoroscopy Time:  1 minutes 8 seconds Number of Acquired Images:  2 FINDINGS: Interbody fusion is noted at L3-4 with bilateral pedicular screw placement and posterior fixation. IMPRESSION: Intraoperative localization for L3-4 fusion Electronically Signed   By: Alcide Clever M.D.   On: 03/13/2016 14:09    Antibiotics:  Anti-infectives    Start     Dose/Rate Route Frequency Ordered Stop   03/13/16 2200  vancomycin (VANCOCIN) IVPB 1000 mg/200 mL premix     1,000 mg 200 mL/hr over 60 Minutes  Intravenous Every 12 hours 03/13/16 1657     03/13/16 1353  vancomycin (VANCOCIN) powder  Status:  Discontinued       As needed 03/13/16 1354 03/13/16 1427   03/13/16 1241  bacitracin 50,000 Units in sodium chloride irrigation 0.9 % 500 mL irrigation  Status:  Discontinued       As needed 03/13/16 1241 03/13/16 1427   03/13/16 1200  vancomycin (VANCOCIN) IVPB 1000 mg/200 mL premix     1,000 mg 200 mL/hr over 60 Minutes Intravenous To [facility] 03/12/16 1431 03/13/16 1300   03/13/16 1117  vancomycin (VANCOCIN) 1000 MG powder    Comments:  Emelda [employer]   : cabinet override      03/13/16 1117 03/13/16 2329      Discharge Exam: Blood pressure 131/54, pulse 89, temperature 98.2 F (36.8 C), temperature source Oral, resp. rate 19, height 5\' 4"  (1.626 m), weight 96.758 kg (213 lb 5 oz), SpO2 98 %. Neurologic: Grossly normal Dressing dry  Discharge Medications:     Medication List    STOP taking these medications        diclofenac 50 MG EC tablet  Commonly known as:  VOLTAREN      TAKE these medications        busPIRone 5 MG tablet  Commonly known as:  BUSPAR  Take 5 mg by mouth 2 (two) times daily as needed.     colesevelam 625 MG tablet  Commonly known as:  WELCHOL  Take 1,875 mg by mouth 2 (two) times daily with a meal.     cyclobenzaprine 10 MG tablet  Commonly known as:  FLEXERIL  Take 1 tablet (10 mg total) by mouth 3 (three) times daily as needed for muscle spasms.     DULoxetine 60 MG capsule  Commonly known as:  CYMBALTA  Take 60 mg by mouth daily before breakfast.     esomeprazole 40 MG capsule  Commonly known as:  NEXIUM  Take 40 mg by mouth daily before breakfast.     furosemide 40 MG tablet  Commonly known as:  LASIX  Take 40 mg by mouth daily.     gabapentin 400 MG capsule  Commonly known as:  NEURONTIN  Take 800 mg by mouth 2 (two) times daily.     insulin glargine 100 UNIT/ML injection  Commonly known as:  LANTUS  Inject 40 Units into  the skin at bedtime.     INVOKANA 300 MG Tabs tablet  Generic drug:  canagliflozin  Take 300 mg by mouth daily before breakfast.     loratadine 10 MG tablet  Commonly known as:  CLARITIN  Take 10 mg by mouth daily.     metFORMIN 500 MG  tablet  Commonly known as:  GLUCOPHAGE  Take 500 mg by mouth 3 (three) times daily.     oxyCODONE-acetaminophen 10-325 MG tablet  Commonly known as:  PERCOCET  Take 1-2 tablets by mouth every 6 (six) hours as needed for pain.     temazepam 30 MG capsule  Commonly known as:  RESTORIL  Take 30 mg by mouth at bedtime.     topiramate 25 MG tablet  Commonly known as:  TOPAMAX  Take 25 mg by mouth 2 (two) times daily.     traMADol 50 MG tablet  Commonly known as:  ULTRAM  Take 50 mg by mouth every 8 (eight) hours as needed for moderate pain.     VICTOZA 18 MG/3ML Sopn  Generic drug:  Liraglutide  Inject 1.8 mg into the skin at bedtime.        Disposition: home   Final Dx: TLIF L3-4      Discharge Instructions     Remove dressing in 72 hours    Complete by:  As directed      Call MD for:  difficulty breathing, headache or visual disturbances    Complete by:  As directed      Call MD for:  persistant nausea and vomiting    Complete by:  As directed      Call MD for:  redness, tenderness, or signs of infection (pain, swelling, redness, odor or green/yellow discharge around incision site)    Complete by:  As directed      Call MD for:  severe uncontrolled pain    Complete by:  As directed      Call MD for:  temperature >100.4    Complete by:  As directed      Diet - low sodium heart healthy    Complete by:  As directed      Discharge instructions    Complete by:  As directed   No heavy lifting, no driving, may shower     Increase activity slowly    Complete by:  As directed               Signed: Kinser Fellman S 03/15/2016, 12:02 PM

## 2016-03-15 NOTE — Care Management Note (Signed)
Case Management Note  Patient Details  Name: Gabrielle Esparza MRN: [id number] Date of Birth: 04-06-1956  Subjective/Objective:                    Action/Plan: Patient discharging home with self care. Pt with orders for walker and 3 in 1. CM called Jermaine with [facility] and he will deliver the equipment to the room. Bedside RN updated.   Expected Discharge Date:                  Expected Discharge Plan:  Home/Self Care  In-House Referral:     Discharge planning Services  CM Consult  Post Acute Care Choice:  Durable Medical Equipment Choice offered to:  Patient  DME Arranged:  3-N-1, Walker rolling DME Agency:  [facility]  HH Arranged:    HH Agency:     Status of Service:  Completed, signed off  Medicare Important Message Given:    Date Medicare IM Given:    Medicare IM give by:    Date Additional Medicare IM Given:    Additional Medicare Important Message give by:     If discussed at Long Length of Stay Meetings, dates discussed:    Additional Comments:  Kermit Balo, RN 03/15/2016, 12:16 PM

## 2016-03-15 NOTE — Progress Notes (Signed)
Pt ambulated multiple times throughout the night using walker and brace without difficulty.  Will continue to monitor.   Estanislado Emms, RN

## 2016-03-15 NOTE — Progress Notes (Signed)
Patient is being d/c home. D/c instructions given and patient verbalized understanding. 

## 2016-03-15 NOTE — Progress Notes (Signed)
Occupational Therapy Treatment Patient Details Name: Gabrielle Esparza MRN: [id number] DOB: [birth year] Today's Date: 03/15/2016    History of present illness s/p TLIF L3-4  PMHx- microdiscectomy L3-4 12/01/15; DM; fibromyalgia; anxiety   OT comments  Pt. Able to complete LB ADLS without A/E needs.  Also states husband available to assist as needed.  Able to maintain and incorporate back precautions during functional mobility today.  Next session tub transfer with 3n1.    Follow Up Recommendations  No OT follow up;Supervision - Intermittent    Equipment Recommendations  3 in 1 bedside comode;Other (comment)    Recommendations for Other Services      Precautions / Restrictions Precautions Precautions: Back;Fall Precaution Comments: pt able to describe 2/3 with follow-up education provided Required Braces or Orthoses: Spinal Brace Spinal Brace: Lumbar corset;Applied in sitting position Restrictions Weight Bearing Restrictions: No       Mobility Bed Mobility Overal bed mobility: Needs Assistance Bed Mobility: Rolling;Sidelying to Sit Rolling: Supervision Sidelying to sit: Min guard       General bed mobility comments: in recliner upon arrival  Transfers Overall transfer level: Needs assistance Equipment used: Rolling walker (2 wheeled) Transfers: Sit to/from Stand Sit to Stand: Min guard         General transfer comment: vc for safe use of RW; x3     Balance Overall balance assessment: Needs assistance         Standing balance support: No upper extremity supported Standing balance-Leahy Scale: Good Standing balance comment: washing hands at sink without difficulty                   ADL Overall ADL's : Needs assistance/impaired               Lower Body Bathing Details (indicate cue type and reason): able to perform simulated by crossing L/R leg over knees no A/E needs as husban also able to assist       Lower Body Dressing Details (indicate cue  type and reason): able to perform simulated by crossing L/R leg over knees no A/E needs as husban also able to assist Toilet Transfer: Min guard;Ambulation;RW Toilet Transfer Details (indicate cue type and reason): simiulated Toileting- Clothing Manipulation and Hygiene: Sitting/lateral lean;Sit to/from stand;Supervision/safety Toileting - Clothing Manipulation Details (indicate cue type and reason): had pt. simulate task to ensure she was able to complete while following back precautions.  pt. able to complete pericare while maintaining precautions.  no A/E necessary   Tub/Shower Transfer Details (indicate cue type and reason): reviewed tech. and provided demo.  pt. delcines return demo in gym as she has just completed PT and was tired and c/o "pressure" in her back.  plans to attempt tomorrow Functional mobility during ADLs: Min guard;Rolling walker General ADL Comments: reviewed education on maintaining back precautions during functional mobility.  also reviewed ADLS and pt. has no apparent A/E needs at this time      Vision                     Perception     Praxis      Cognition   Behavior During Therapy: [facility] for tasks assessed/performed Overall Cognitive Status: Within Functional Limits for tasks assessed                       Extremity/Trunk Assessment               Exercises  Shoulder Instructions       General Comments      Pertinent Vitals/ Pain       Pain Assessment:  ("pressure") Pain Score: 8  Pain Location: back Pain Descriptors / Indicators: Pressure Pain Intervention(s): Limited activity within patient's tolerance;Monitored during session;Repositioned;Premedicated before session  Home Living                                          Prior Functioning/Environment              Frequency Min 2X/week     Progress Toward Goals  OT Goals(current goals can now be found in the care plan section)  Progress  towards OT goals: Progressing toward goals  Acute Rehab OT Goals Patient Stated Goal: return home Sat/Sun  Plan Discharge plan remains appropriate    Co-evaluation                 End of Session Equipment Utilized During Treatment: Rolling walker;Back brace   Activity Tolerance Patient tolerated treatment well   Patient Left in chair;with call bell/phone within reach   Nurse Communication          Time: 0921-0933 OT Time Calculation (min): 12 min  Charges: OT General Charges $OT Visit: 1 Procedure OT Treatments $Self Care/Home Management : 8-22 mins  Robet Leu, COTA/L 03/15/2016, 9:48 AM

## 2016-03-20 ENCOUNTER — Other Ambulatory Visit (HOSPITAL_COMMUNITY): Payer: Medicaid Other

## 2016-08-09 ENCOUNTER — Other Ambulatory Visit: Payer: Self-pay | Admitting: Neurological Surgery

## 2016-08-09 DIAGNOSIS — M79604 Pain in right leg: Secondary | ICD-10-CM

## 2016-08-14 ENCOUNTER — Ambulatory Visit (INDEPENDENT_AMBULATORY_CARE_PROVIDER_SITE_OTHER): Payer: Medicaid Other

## 2016-08-14 DIAGNOSIS — M4806 Spinal stenosis, lumbar region: Secondary | ICD-10-CM

## 2016-08-14 DIAGNOSIS — M129 Arthropathy, unspecified: Secondary | ICD-10-CM

## 2016-08-14 DIAGNOSIS — M5116 Intervertebral disc disorders with radiculopathy, lumbar region: Secondary | ICD-10-CM | POA: Diagnosis not present

## 2016-08-14 DIAGNOSIS — M4728 Other spondylosis with radiculopathy, sacral and sacrococcygeal region: Secondary | ICD-10-CM

## 2016-08-14 DIAGNOSIS — M4807 Spinal stenosis, lumbosacral region: Secondary | ICD-10-CM

## 2016-08-14 DIAGNOSIS — M79604 Pain in right leg: Secondary | ICD-10-CM

## 2017-04-15 ENCOUNTER — Other Ambulatory Visit: Payer: Self-pay | Admitting: Neurological Surgery

## 2017-04-15 DIAGNOSIS — M545 Low back pain: Secondary | ICD-10-CM

## 2017-04-22 ENCOUNTER — Ambulatory Visit (INDEPENDENT_AMBULATORY_CARE_PROVIDER_SITE_OTHER): Payer: Medicaid Other

## 2017-04-22 DIAGNOSIS — M4316 Spondylolisthesis, lumbar region: Secondary | ICD-10-CM

## 2017-04-22 DIAGNOSIS — M545 Low back pain: Secondary | ICD-10-CM

## 2017-04-25 ENCOUNTER — Other Ambulatory Visit: Payer: Self-pay | Admitting: Neurological Surgery

## 2017-04-25 DIAGNOSIS — M79604 Pain in right leg: Secondary | ICD-10-CM

## 2017-05-01 ENCOUNTER — Ambulatory Visit
Admission: RE | Admit: 2017-05-01 | Discharge: 2017-05-01 | Disposition: A | Discharge status: Home / Self Care | Payer: Medicaid Other | Source: Ambulatory Visit | Attending: Neurological Surgery | Admitting: Neurological Surgery

## 2017-05-01 VITALS — BP 138/64 | HR 74

## 2017-05-01 DIAGNOSIS — M79604 Pain in right leg: Secondary | ICD-10-CM

## 2017-05-01 DIAGNOSIS — Z981 Arthrodesis status: Secondary | ICD-10-CM

## 2017-05-01 DIAGNOSIS — Z9889 Other specified postprocedural states: Secondary | ICD-10-CM

## 2017-05-01 MED ORDER — DIAZEPAM 5 MG PO TABS
10.0000 mg | ORAL_TABLET | Freq: Once | ORAL | Status: AC
  Administered 2017-05-01: 10 mg via ORAL

## 2017-05-01 MED ORDER — IOPAMIDOL (ISOVUE-M 200) INJECTION 41%
15.0000 mL | Freq: Once | INTRAMUSCULAR | Status: AC
  Administered 2017-05-01: 15 mL via INTRATHECAL

## 2017-05-01 NOTE — Progress Notes (Signed)
Pt stopped Cymbalta, Tramadol, Buspar and Imitrex since Monday.

## 2017-05-01 NOTE — Discharge Instructions (Signed)
Myelogram Discharge Instructions  1. Go home and rest quietly for the next 24 hours.  It is important to lie flat for the next 24 hours.  Get up only to go to the restroom.  You may lie in the bed or on a couch on your back, your stomach, your left side or your right side.  You may have one pillow under your head.  You may have pillows between your knees while you are on your side or under your knees while you are on your back.  2. DO NOT drive today.  Recline the seat as far back as it will go, while still wearing your seat belt, on the way home.  3. You may get up to go to the bathroom as needed.  You may sit up for 10 minutes to eat.  You may resume your normal diet and medications unless otherwise indicated.  Drink lots of extra fluids today and tomorrow.  4. The incidence of headache, nausea, or vomiting is about 5% (one in 20 patients).  If you develop a headache, lie flat and drink plenty of fluids until the headache goes away.  Caffeinated beverages may be helpful.  If you develop severe nausea and vomiting or a headache that does not go away with flat bed rest, call [phone].  5. You may resume normal activities after your 24 hours of bed rest is over; however, do not exert yourself strongly or do any heavy lifting tomorrow. If when you get up you have a headache when standing, go back to bed and force fluids for another 24 hours.  6. Call your physician for a follow-up appointment.  The results of your myelogram will be sent directly to your physician by the following day.  7. If you have any questions or if complications develop after you arrive home, please call [phone].  Discharge instructions have been explained to the patient.  The patient, or the person responsible for the patient, fully understands these instructions.        May resume Buspar, Cymbalta, Tramadol and Imitrex on May 02, 2017, after 1:00 pm.

## 2017-05-13 ENCOUNTER — Other Ambulatory Visit: Payer: Self-pay | Admitting: Neurological Surgery

## 2017-06-06 NOTE — Pre-Procedure Instructions (Signed)
NHYIRA LEANO  06/06/2017      [id number] - [place], [place] - [address] [place] [address] Phone: [phone] Fax: [phone]    Your procedure is scheduled on 06/18/17.  Report to [facility] Admitting at 1045 A.M.  Call this number if you have problems the morning of surgery:  [phone]   Remember:  Do not eat food or drink liquids after midnight.  Take these medicines the morning of surgery with A SIP OF WATER --cymbalta,nexium,neurontin,hydrocodone   Do not wear jewelry, make-up or nail polish.  Do not wear lotions, powders, or perfumes, or deoderant.  Do not shave 48 hours prior to surgery.  Men may shave face and neck.  Do not bring valuables to the hospital.  [facility] is not responsible for any belongings or valuables.  Contacts, dentures or bridgework may not be worn into surgery.  Leave your suitcase in the car.  After surgery it may be brought to your room.  For patients admitted to the hospital, discharge time will be determined by your treatment team.  Patients discharged the day of surgery will not be allowed to drive home.   Name and phone number of your driver:    Special instructions:  Do not take any aspirin,anti-inflammatories,vitamins,or herbal supplements 5-7 days prior to surgery.  Please read over the following fact sheets that you were given. MRSA Information    How to Manage Your Diabetes Before and After Surgery  Why is it important to control my blood sugar before and after surgery? . Improving blood sugar levels before and after surgery helps healing and can limit problems. . A way of improving blood sugar control is eating a healthy diet by: o  Eating less sugar and carbohydrates o  Increasing activity/exercise o  Talking with your doctor about reaching your blood sugar goals . High blood sugars (greater than 180 mg/dL) can raise your risk of infections and slow your  recovery, so you will need to focus on controlling your diabetes during the weeks before surgery. . Make sure that the doctor who takes care of your diabetes knows about your planned surgery including the date and location.  How do I manage my blood sugar before surgery? . Check your blood sugar at least 4 times a day, starting 2 days before surgery, to make sure that the level is not too high or low. o Check your blood sugar the morning of your surgery when you wake up and every 2 hours until you get to the Short Stay unit. . If your blood sugar is less than 70 mg/dL, you will need to treat for low blood sugar: o Do not take insulin. o Treat a low blood sugar (less than 70 mg/dL) with  cup of clear juice (cranberry or apple), 4 glucose tablets, OR glucose gel. o Recheck blood sugar in 15 minutes after treatment (to make sure it is greater than 70 mg/dL). If your blood sugar is not greater than 70 mg/dL on recheck, call [phone] for further instructions. . Report your blood sugar to the short stay nurse when you get to Short Stay.  . If you are admitted to the hospital after surgery: o Your blood sugar will be checked by the staff and you will probably be given insulin after surgery (instead of oral diabetes medicines) to make sure you have good blood sugar levels. o The goal for blood sugar control  after surgery is 80-180 mg/dL.              WHAT DO I DO ABOUT MY DIABETES MEDICATION?   Marland Kitchen Do not take oral diabetes medicines (pills) the morning of surgery.  . THE NIGHT BEFORE SURGERY, take ___________ units of ___________insulin.       Marland Kitchen HE MORNING OF SURGERY, take _____________ units of __________insulin.  . The day of surgery, do not take other diabetes injectables, including Byetta (exenatide), Bydureon (exenatide ER), Victoza (liraglutide), or Trulicity (dulaglutide).  . If your CBG is greater than 220 mg/dL, you may take  of your sliding scale (correction) dose of  insulin.  Other Instructions:          Patient Signature:  Date:   Nurse Signature:  Date:   Reviewed and Endorsed by [facility] Patient Education Committee, August 2015

## 2017-06-09 ENCOUNTER — Encounter (HOSPITAL_COMMUNITY): Payer: Self-pay

## 2017-06-09 ENCOUNTER — Encounter (HOSPITAL_COMMUNITY)
Admission: RE | Admit: 2017-06-09 | Discharge: 2017-06-09 | Disposition: A | Discharge status: Home / Self Care | Payer: Medicaid Other | Source: Ambulatory Visit | Attending: Neurological Surgery | Admitting: Neurological Surgery

## 2017-06-09 ENCOUNTER — Ambulatory Visit (HOSPITAL_COMMUNITY)
Admission: RE | Admit: 2017-06-09 | Discharge: 2017-06-09 | Disposition: A | Discharge status: Home / Self Care | Payer: Medicaid Other | Source: Ambulatory Visit | Attending: Neurological Surgery | Admitting: Neurological Surgery

## 2017-06-09 DIAGNOSIS — R9431 Abnormal electrocardiogram [ECG] [EKG]: Secondary | ICD-10-CM | POA: Insufficient documentation

## 2017-06-09 DIAGNOSIS — Z01810 Encounter for preprocedural cardiovascular examination: Secondary | ICD-10-CM | POA: Insufficient documentation

## 2017-06-09 DIAGNOSIS — Z01812 Encounter for preprocedural laboratory examination: Secondary | ICD-10-CM | POA: Insufficient documentation

## 2017-06-09 DIAGNOSIS — M4310 Spondylolisthesis, site unspecified: Secondary | ICD-10-CM

## 2017-06-09 DIAGNOSIS — M4316 Spondylolisthesis, lumbar region: Secondary | ICD-10-CM | POA: Diagnosis not present

## 2017-06-09 LAB — CBC WITH DIFFERENTIAL/PLATELET
BASOS ABS: 0 10*3/uL (ref 0.0–0.1)
Basophils Relative: 0 %
EOS ABS: 0.3 10*3/uL (ref 0.0–0.7)
EOS PCT: 3 %
HCT: 42.2 % (ref 36.0–46.0)
HEMOGLOBIN: 13.3 g/dL (ref 12.0–15.0)
LYMPHS ABS: 2.9 10*3/uL (ref 0.7–4.0)
Lymphocytes Relative: 32 %
MCH: 27.5 pg (ref 26.0–34.0)
MCHC: 31.5 g/dL (ref 30.0–36.0)
MCV: 87.2 fL (ref 78.0–100.0)
Monocytes Absolute: 0.4 10*3/uL (ref 0.1–1.0)
Monocytes Relative: 4 %
NEUTROS PCT: 61 %
Neutro Abs: 5.6 10*3/uL (ref 1.7–7.7)
PLATELETS: 235 10*3/uL (ref 150–400)
RBC: 4.84 MIL/uL (ref 3.87–5.11)
RDW: 15.8 % — ABNORMAL HIGH (ref 11.5–15.5)
WBC: 9.2 10*3/uL (ref 4.0–10.5)

## 2017-06-09 LAB — SURGICAL PCR SCREEN
MRSA, PCR: NEGATIVE
Staphylococcus aureus: NEGATIVE

## 2017-06-09 LAB — BASIC METABOLIC PANEL
Anion gap: 11 (ref 5–15)
BUN: 11 mg/dL (ref 6–20)
CHLORIDE: 104 mmol/L (ref 101–111)
CO2: 24 mmol/L (ref 22–32)
CREATININE: 0.83 mg/dL (ref 0.44–1.00)
Calcium: 9.1 mg/dL (ref 8.9–10.3)
GFR calc Af Amer: >60 mL/min (ref >60)
Glucose, Bld: 140 mg/dL — ABNORMAL HIGH (ref 65–99)
POTASSIUM: 4 mmol/L (ref 3.5–5.1)
SODIUM: 139 mmol/L (ref 135–145)

## 2017-06-09 LAB — PROTIME-INR
INR: 0.97
PROTHROMBIN TIME: 12.9 s (ref 11.4–15.2)

## 2017-06-09 LAB — GLUCOSE, CAPILLARY: Glucose-Capillary: 146 mg/dL — ABNORMAL HIGH (ref 65–99)

## 2017-06-09 MED ORDER — CHLORHEXIDINE GLUCONATE CLOTH 2 % EX PADS: 6.0000 | MEDICATED_PAD | Freq: Once | CUTANEOUS | Status: DC

## 2017-06-10 LAB — HEMOGLOBIN A1C
Hgb A1c MFr Bld: 6.4 % — ABNORMAL HIGH (ref 4.8–5.6)
MEAN PLASMA GLUCOSE: 137 mg/dL

## 2017-06-10 MED ORDER — DEXAMETHASONE SODIUM PHOSPHATE 10 MG/ML IJ SOLN
10.0000 mg | INTRAMUSCULAR | Status: AC
  Administered 2017-06-12: 10 mg via INTRAVENOUS

## 2017-06-10 MED ORDER — VANCOMYCIN HCL 10 G IV SOLR
1500.0000 mg | INTRAVENOUS | Status: AC
  Administered 2017-06-12: 1500 mg via INTRAVENOUS
  Filled 2017-06-10: qty 1500

## 2017-06-12 ENCOUNTER — Inpatient Hospital Stay (HOSPITAL_COMMUNITY)
Admission: RE | Admit: 2017-06-12 | Discharge: 2017-06-13 | DRG: 455 | Disposition: A | Discharge status: Home / Self Care | Payer: Medicaid Other | Source: Ambulatory Visit | Attending: Neurological Surgery | Admitting: Neurological Surgery

## 2017-06-12 ENCOUNTER — Inpatient Hospital Stay (HOSPITAL_COMMUNITY): Payer: Medicaid Other

## 2017-06-12 ENCOUNTER — Inpatient Hospital Stay (HOSPITAL_COMMUNITY): Payer: Medicaid Other | Admitting: Anesthesiology

## 2017-06-12 ENCOUNTER — Inpatient Hospital Stay (HOSPITAL_COMMUNITY): Payer: Medicaid Other | Admitting: Emergency Medicine

## 2017-06-12 ENCOUNTER — Encounter (HOSPITAL_COMMUNITY)
Admission: RE | Disposition: A | Discharge status: Home / Self Care | Payer: Self-pay | Source: Ambulatory Visit | Attending: Neurological Surgery

## 2017-06-12 ENCOUNTER — Encounter (HOSPITAL_COMMUNITY): Payer: Self-pay

## 2017-06-12 DIAGNOSIS — G4730 Sleep apnea, unspecified: Secondary | ICD-10-CM | POA: Diagnosis present

## 2017-06-12 DIAGNOSIS — Z881 Allergy status to other antibiotic agents status: Secondary | ICD-10-CM | POA: Diagnosis not present

## 2017-06-12 DIAGNOSIS — K219 Gastro-esophageal reflux disease without esophagitis: Secondary | ICD-10-CM | POA: Diagnosis present

## 2017-06-12 DIAGNOSIS — Z79899 Other long term (current) drug therapy: Secondary | ICD-10-CM | POA: Diagnosis not present

## 2017-06-12 DIAGNOSIS — M19042 Primary osteoarthritis, left hand: Secondary | ICD-10-CM | POA: Diagnosis present

## 2017-06-12 DIAGNOSIS — F419 Anxiety disorder, unspecified: Secondary | ICD-10-CM | POA: Diagnosis present

## 2017-06-12 DIAGNOSIS — E119 Type 2 diabetes mellitus without complications: Secondary | ICD-10-CM | POA: Diagnosis present

## 2017-06-12 DIAGNOSIS — E669 Obesity, unspecified: Secondary | ICD-10-CM | POA: Diagnosis present

## 2017-06-12 DIAGNOSIS — Z794 Long term (current) use of insulin: Secondary | ICD-10-CM

## 2017-06-12 DIAGNOSIS — Z9189 Other specified personal risk factors, not elsewhere classified: Secondary | ICD-10-CM

## 2017-06-12 DIAGNOSIS — M4316 Spondylolisthesis, lumbar region: Secondary | ICD-10-CM | POA: Diagnosis present

## 2017-06-12 DIAGNOSIS — Z791 Long term (current) use of non-steroidal anti-inflammatories (NSAID): Secondary | ICD-10-CM | POA: Diagnosis not present

## 2017-06-12 DIAGNOSIS — M069 Rheumatoid arthritis, unspecified: Secondary | ICD-10-CM | POA: Diagnosis present

## 2017-06-12 DIAGNOSIS — Z888 Allergy status to other drugs, medicaments and biological substances status: Secondary | ICD-10-CM | POA: Diagnosis not present

## 2017-06-12 DIAGNOSIS — G43909 Migraine, unspecified, not intractable, without status migrainosus: Secondary | ICD-10-CM | POA: Diagnosis present

## 2017-06-12 DIAGNOSIS — Z9989 Dependence on other enabling machines and devices: Secondary | ICD-10-CM | POA: Diagnosis not present

## 2017-06-12 DIAGNOSIS — Z981 Arthrodesis status: Secondary | ICD-10-CM

## 2017-06-12 DIAGNOSIS — Z419 Encounter for procedure for purposes other than remedying health state, unspecified: Secondary | ICD-10-CM

## 2017-06-12 DIAGNOSIS — M19041 Primary osteoarthritis, right hand: Secondary | ICD-10-CM | POA: Diagnosis present

## 2017-06-12 DIAGNOSIS — I10 Essential (primary) hypertension: Secondary | ICD-10-CM | POA: Diagnosis present

## 2017-06-12 DIAGNOSIS — M79606 Pain in leg, unspecified: Secondary | ICD-10-CM | POA: Diagnosis present

## 2017-06-12 DIAGNOSIS — M797 Fibromyalgia: Secondary | ICD-10-CM | POA: Diagnosis present

## 2017-06-12 DIAGNOSIS — Z6838 Body mass index (BMI) 38.0-38.9, adult: Secondary | ICD-10-CM | POA: Diagnosis not present

## 2017-06-12 DIAGNOSIS — M48061 Spinal stenosis, lumbar region without neurogenic claudication: Principal | ICD-10-CM | POA: Diagnosis present

## 2017-06-12 DIAGNOSIS — Z9119 Patient's noncompliance with other medical treatment and regimen: Secondary | ICD-10-CM

## 2017-06-12 LAB — GLUCOSE, CAPILLARY
GLUCOSE-CAPILLARY: 89 mg/dL (ref 65–99)
GLUCOSE-CAPILLARY: 125 mg/dL — AB (ref 65–99)
Glucose-Capillary: 112 mg/dL — ABNORMAL HIGH (ref 65–99)
Glucose-Capillary: 82 mg/dL (ref 65–99)

## 2017-06-12 SURGERY — POSTERIOR LUMBAR FUSION 1 LEVEL
Anesthesia: General | Site: Spine Lumbar

## 2017-06-12 MED ORDER — INSULIN ASPART 100 UNIT/ML ~~LOC~~ SOLN
0.0000 [IU] | Freq: Three times a day (TID) | SUBCUTANEOUS | Status: DC
  Administered 2017-06-13: 3 [IU] via SUBCUTANEOUS

## 2017-06-12 MED ORDER — FENTANYL CITRATE (PF) 250 MCG/5ML IJ SOLN
INTRAMUSCULAR | Status: AC
  Filled 2017-06-12: qty 5

## 2017-06-12 MED ORDER — VECURONIUM BROMIDE 10 MG IV SOLR
INTRAVENOUS | Status: AC
  Filled 2017-06-12: qty 10

## 2017-06-12 MED ORDER — VANCOMYCIN HCL 1000 MG IV SOLR
INTRAVENOUS | Status: AC
  Filled 2017-06-12: qty 1000

## 2017-06-12 MED ORDER — MIDAZOLAM HCL 5 MG/5ML IJ SOLN
INTRAMUSCULAR | Status: DC | PRN
  Administered 2017-06-12: 2 mg via INTRAVENOUS

## 2017-06-12 MED ORDER — VANCOMYCIN HCL IN DEXTROSE 1-5 GM/200ML-% IV SOLN
1000.0000 mg | Freq: Once | INTRAVENOUS | Status: AC
  Administered 2017-06-13: 1000 mg via INTRAVENOUS
  Filled 2017-06-12: qty 200

## 2017-06-12 MED ORDER — METFORMIN HCL 500 MG PO TABS
500.0000 mg | ORAL_TABLET | Freq: Three times a day (TID) | ORAL | Status: DC
  Administered 2017-06-13 (×2): 500 mg via ORAL
  Filled 2017-06-12 (×2): qty 1

## 2017-06-12 MED ORDER — PROMETHAZINE HCL 25 MG PO TABS: 25.0000 mg | ORAL_TABLET | Freq: Every day | ORAL | Status: DC | PRN

## 2017-06-12 MED ORDER — ACETAMINOPHEN 325 MG PO TABS: 650.0000 mg | ORAL_TABLET | ORAL | Status: DC | PRN

## 2017-06-12 MED ORDER — LACTATED RINGERS IV SOLN
INTRAVENOUS | Status: DC | PRN
  Administered 2017-06-12 (×2): via INTRAVENOUS

## 2017-06-12 MED ORDER — ONDANSETRON HCL 4 MG PO TABS: 4.0000 mg | ORAL_TABLET | Freq: Four times a day (QID) | ORAL | Status: DC | PRN

## 2017-06-12 MED ORDER — MENTHOL 3 MG MT LOZG: 1.0000 | LOZENGE | OROMUCOSAL | Status: DC | PRN

## 2017-06-12 MED ORDER — HYDROMORPHONE HCL 1 MG/ML IJ SOLN
INTRAMUSCULAR | Status: AC
  Administered 2017-06-12: 0.5 mg via INTRAVENOUS
  Filled 2017-06-12: qty 0.5

## 2017-06-12 MED ORDER — SODIUM CHLORIDE 0.9 % IR SOLN
Status: DC | PRN
  Administered 2017-06-12: 17:00:00

## 2017-06-12 MED ORDER — SUGAMMADEX SODIUM 200 MG/2ML IV SOLN
INTRAVENOUS | Status: AC
  Filled 2017-06-12: qty 2

## 2017-06-12 MED ORDER — VANCOMYCIN HCL 1000 MG IV SOLR
INTRAVENOUS | Status: DC | PRN
  Administered 2017-06-12: 1000 mg via TOPICAL

## 2017-06-12 MED ORDER — CELECOXIB 200 MG PO CAPS
200.0000 mg | ORAL_CAPSULE | Freq: Two times a day (BID) | ORAL | Status: DC
  Administered 2017-06-12 – 2017-06-13 (×2): 200 mg via ORAL
  Filled 2017-06-12 (×2): qty 1

## 2017-06-12 MED ORDER — GABAPENTIN 400 MG PO CAPS: 800.0000 mg | ORAL_CAPSULE | Freq: Two times a day (BID) | ORAL | Status: DC

## 2017-06-12 MED ORDER — SODIUM CHLORIDE 0.9 % IV SOLN: 250.0000 mL | INTRAVENOUS | Status: DC

## 2017-06-12 MED ORDER — PHENOL 1.4 % MT LIQD: 1.0000 | OROMUCOSAL | Status: DC | PRN

## 2017-06-12 MED ORDER — MEPERIDINE HCL 25 MG/ML IJ SOLN: 6.2500 mg | INTRAMUSCULAR | Status: DC | PRN

## 2017-06-12 MED ORDER — PROMETHAZINE HCL 25 MG/ML IJ SOLN: 6.2500 mg | INTRAMUSCULAR | Status: DC | PRN

## 2017-06-12 MED ORDER — PHENYLEPHRINE 40 MCG/ML (10ML) SYRINGE FOR IV PUSH (FOR BLOOD PRESSURE SUPPORT)
PREFILLED_SYRINGE | INTRAVENOUS | Status: DC | PRN
Start: 2017-06-12 — End: 2017-06-12
  Administered 2017-06-12 (×5): 80 ug via INTRAVENOUS

## 2017-06-12 MED ORDER — KETOROLAC TROMETHAMINE 30 MG/ML IJ SOLN
INTRAMUSCULAR | Status: AC
  Filled 2017-06-12: qty 1

## 2017-06-12 MED ORDER — GABAPENTIN 400 MG PO CAPS
1200.0000 mg | ORAL_CAPSULE | Freq: Every day | ORAL | Status: DC
  Administered 2017-06-12: 1200 mg via ORAL
  Filled 2017-06-12: qty 3

## 2017-06-12 MED ORDER — TOPIRAMATE 25 MG PO TABS
50.0000 mg | ORAL_TABLET | Freq: Every day | ORAL | Status: DC
  Administered 2017-06-13: 50 mg via ORAL
  Filled 2017-06-12: qty 2

## 2017-06-12 MED ORDER — ROCURONIUM BROMIDE 100 MG/10ML IV SOLN
INTRAVENOUS | Status: DC | PRN
  Administered 2017-06-12: 20 mg via INTRAVENOUS
  Administered 2017-06-12: 10 mg via INTRAVENOUS
  Administered 2017-06-12: 50 mg via INTRAVENOUS
  Administered 2017-06-12: 20 mg via INTRAVENOUS

## 2017-06-12 MED ORDER — LIDOCAINE HCL (CARDIAC) 20 MG/ML IV SOLN
INTRAVENOUS | Status: DC | PRN
  Administered 2017-06-12: 80 mg via INTRAVENOUS

## 2017-06-12 MED ORDER — KETOROLAC TROMETHAMINE 30 MG/ML IJ SOLN
INTRAMUSCULAR | Status: DC | PRN
  Administered 2017-06-12: 30 mg via INTRAVENOUS

## 2017-06-12 MED ORDER — HYDROCODONE-ACETAMINOPHEN 10-325 MG PO TABS
1.0000 | ORAL_TABLET | Freq: Four times a day (QID) | ORAL | Status: DC | PRN
  Administered 2017-06-12 – 2017-06-13 (×3): 2 via ORAL
  Filled 2017-06-12 (×3): qty 2

## 2017-06-12 MED ORDER — PANTOPRAZOLE SODIUM 40 MG PO TBEC
40.0000 mg | DELAYED_RELEASE_TABLET | Freq: Every day | ORAL | Status: DC
  Administered 2017-06-13: 40 mg via ORAL
  Filled 2017-06-12: qty 1

## 2017-06-12 MED ORDER — DULOXETINE HCL 60 MG PO CPEP
60.0000 mg | ORAL_CAPSULE | Freq: Two times a day (BID) | ORAL | Status: DC
  Administered 2017-06-12 – 2017-06-13 (×2): 60 mg via ORAL
  Filled 2017-06-12 (×2): qty 1

## 2017-06-12 MED ORDER — BUPIVACAINE HCL (PF) 0.25 % IJ SOLN
INTRAMUSCULAR | Status: DC | PRN
  Administered 2017-06-12: 5 mL

## 2017-06-12 MED ORDER — GELATIN ABSORBABLE MT POWD
OROMUCOSAL | Status: DC | PRN
  Administered 2017-06-12: 17:00:00 via TOPICAL

## 2017-06-12 MED ORDER — SCOPOLAMINE 1 MG/3DAYS TD PT72: 1.0000 | MEDICATED_PATCH | TRANSDERMAL | Status: DC

## 2017-06-12 MED ORDER — COLESEVELAM HCL 625 MG PO TABS
1875.0000 mg | ORAL_TABLET | Freq: Two times a day (BID) | ORAL | Status: DC
  Administered 2017-06-13: 1875 mg via ORAL
  Filled 2017-06-12 (×2): qty 3

## 2017-06-12 MED ORDER — MIDAZOLAM HCL 2 MG/2ML IJ SOLN
INTRAMUSCULAR | Status: AC
  Filled 2017-06-12: qty 2

## 2017-06-12 MED ORDER — CANAGLIFLOZIN 300 MG PO TABS
300.0000 mg | ORAL_TABLET | Freq: Every day | ORAL | Status: DC
  Administered 2017-06-13: 300 mg via ORAL
  Filled 2017-06-12: qty 1

## 2017-06-12 MED ORDER — POTASSIUM CHLORIDE IN NACL 20-0.9 MEQ/L-% IV SOLN
INTRAVENOUS | Status: DC
  Administered 2017-06-12: 20:00:00 via INTRAVENOUS
  Filled 2017-06-12 (×2): qty 1000

## 2017-06-12 MED ORDER — ONDANSETRON HCL 4 MG/2ML IJ SOLN
INTRAMUSCULAR | Status: DC | PRN
  Administered 2017-06-12: 4 mg via INTRAVENOUS

## 2017-06-12 MED ORDER — MORPHINE SULFATE (PF) 2 MG/ML IV SOLN
2.0000 mg | INTRAVENOUS | Status: DC | PRN
  Administered 2017-06-13 (×2): 2 mg via INTRAVENOUS
  Filled 2017-06-12 (×2): qty 1

## 2017-06-12 MED ORDER — FENTANYL CITRATE (PF) 100 MCG/2ML IJ SOLN
INTRAMUSCULAR | Status: DC | PRN
  Administered 2017-06-12 (×3): 50 ug via INTRAVENOUS
  Administered 2017-06-12: 100 ug via INTRAVENOUS

## 2017-06-12 MED ORDER — LIRAGLUTIDE 18 MG/3ML ~~LOC~~ SOPN: 1.8000 mg | PEN_INJECTOR | Freq: Every day | SUBCUTANEOUS | Status: DC

## 2017-06-12 MED ORDER — 0.9 % SODIUM CHLORIDE (POUR BTL) OPTIME
TOPICAL | Status: DC | PRN
  Administered 2017-06-12: 1000 mL

## 2017-06-12 MED ORDER — HYDROMORPHONE HCL 1 MG/ML IJ SOLN
0.2500 mg | INTRAMUSCULAR | Status: DC | PRN
  Administered 2017-06-12 (×2): 0.5 mg via INTRAVENOUS

## 2017-06-12 MED ORDER — PROPOFOL 10 MG/ML IV BOLUS
INTRAVENOUS | Status: AC
  Filled 2017-06-12: qty 20

## 2017-06-12 MED ORDER — ARTIFICIAL TEARS OPHTHALMIC OINT
TOPICAL_OINTMENT | OPHTHALMIC | Status: AC
  Filled 2017-06-12: qty 3.5

## 2017-06-12 MED ORDER — ACETAMINOPHEN 650 MG RE SUPP: 650.0000 mg | RECTAL | Status: DC | PRN

## 2017-06-12 MED ORDER — SODIUM CHLORIDE 0.9% FLUSH: 3.0000 mL | Freq: Two times a day (BID) | INTRAVENOUS | Status: DC

## 2017-06-12 MED ORDER — THROMBIN 20000 UNITS EX SOLR
CUTANEOUS | Status: DC | PRN
  Administered 2017-06-12: 18:00:00 via TOPICAL

## 2017-06-12 MED ORDER — BUSPIRONE HCL 10 MG PO TABS
5.0000 mg | ORAL_TABLET | Freq: Two times a day (BID) | ORAL | Status: DC
  Administered 2017-06-12 – 2017-06-13 (×2): 5 mg via ORAL
  Filled 2017-06-12 (×2): qty 1

## 2017-06-12 MED ORDER — THROMBIN 5000 UNITS EX SOLR
CUTANEOUS | Status: AC
  Filled 2017-06-12: qty 5000

## 2017-06-12 MED ORDER — HYDROMORPHONE HCL 1 MG/ML IJ SOLN
INTRAMUSCULAR | Status: AC
  Filled 2017-06-12: qty 0.5

## 2017-06-12 MED ORDER — THROMBIN 20000 UNITS EX SOLR
CUTANEOUS | Status: AC
  Filled 2017-06-12: qty 20000

## 2017-06-12 MED ORDER — BUPIVACAINE HCL (PF) 0.25 % IJ SOLN
INTRAMUSCULAR | Status: AC
  Filled 2017-06-12: qty 30

## 2017-06-12 MED ORDER — ONDANSETRON HCL 4 MG/2ML IJ SOLN
INTRAMUSCULAR | Status: AC
  Filled 2017-06-12: qty 2

## 2017-06-12 MED ORDER — CYCLOBENZAPRINE HCL 10 MG PO TABS
10.0000 mg | ORAL_TABLET | Freq: Three times a day (TID) | ORAL | Status: DC | PRN
  Filled 2017-06-12: qty 1

## 2017-06-12 MED ORDER — GABAPENTIN 400 MG PO CAPS
800.0000 mg | ORAL_CAPSULE | Freq: Every day | ORAL | Status: DC
  Administered 2017-06-13: 800 mg via ORAL
  Filled 2017-06-12: qty 2

## 2017-06-12 MED ORDER — DEXAMETHASONE SODIUM PHOSPHATE 10 MG/ML IJ SOLN
INTRAMUSCULAR | Status: AC
  Filled 2017-06-12: qty 1

## 2017-06-12 MED ORDER — SENNA 8.6 MG PO TABS
1.0000 | ORAL_TABLET | Freq: Two times a day (BID) | ORAL | Status: DC
  Administered 2017-06-12 – 2017-06-13 (×2): 8.6 mg via ORAL
  Filled 2017-06-12 (×2): qty 1

## 2017-06-12 MED ORDER — PROPOFOL 10 MG/ML IV BOLUS
INTRAVENOUS | Status: DC | PRN
  Administered 2017-06-12: 160 mg via INTRAVENOUS

## 2017-06-12 MED ORDER — ONDANSETRON HCL 4 MG/2ML IJ SOLN: 4.0000 mg | Freq: Four times a day (QID) | INTRAMUSCULAR | Status: DC | PRN

## 2017-06-12 MED ORDER — FUROSEMIDE 40 MG PO TABS: 40.0000 mg | ORAL_TABLET | Freq: Every day | ORAL | Status: DC | PRN

## 2017-06-12 MED ORDER — PHENYLEPHRINE HCL 10 MG/ML IJ SOLN
INTRAVENOUS | Status: DC | PRN
  Administered 2017-06-12: 30 ug/min via INTRAVENOUS

## 2017-06-12 MED ORDER — SODIUM CHLORIDE 0.9% FLUSH
3.0000 mL | INTRAVENOUS | Status: DC | PRN
Start: 2017-06-12 — End: 2017-06-13

## 2017-06-12 MED ORDER — ARTIFICIAL TEARS OPHTHALMIC OINT
TOPICAL_OINTMENT | OPHTHALMIC | Status: DC | PRN
  Administered 2017-06-12: 1 via OPHTHALMIC

## 2017-06-12 MED ORDER — SCOPOLAMINE 1 MG/3DAYS TD PT72
1.0000 | MEDICATED_PATCH | TRANSDERMAL | Status: DC
Start: 2017-06-12 — End: 2017-06-12
  Administered 2017-06-12: 1.5 mg via TRANSDERMAL
  Filled 2017-06-12 (×2): qty 1

## 2017-06-12 SURGICAL SUPPLY — 61 items
BAG DECANTER FOR FLEXI CONT (MISCELLANEOUS) ×3 IMPLANT
BASKET BONE COLLECTION (BASKET) ×3 IMPLANT
BENZOIN TINCTURE PRP APPL 2/3 (GAUZE/BANDAGES/DRESSINGS) ×3 IMPLANT
BIT DRILL PLIF MAS DISP 5.5MM (DRILL) ×1 IMPLANT
BLADE CLIPPER SURG (BLADE) IMPLANT
BONE CANC CHIPS 20CC PCAN1/4 (Bone Implant) ×3 IMPLANT
BUR MATCHSTICK NEURO 3.0 LAGG (BURR) ×3 IMPLANT
CAGE COROENT PLIF 10X28-8 LUMB (Cage) ×6 IMPLANT
CANISTER SUCT 3000ML PPV (MISCELLANEOUS) ×3 IMPLANT
CARTRIDGE OIL MAESTRO DRILL (MISCELLANEOUS) ×1 IMPLANT
CHIPS CANC BONE 20CC PCAN1/4 (Bone Implant) ×1 IMPLANT
CLOSURE WOUND 1/2 X4 (GAUZE/BANDAGES/DRESSINGS) ×1
CONT SPEC 4OZ CLIKSEAL STRL BL (MISCELLANEOUS) ×3 IMPLANT
COVER BACK TABLE 60X90IN (DRAPES) ×3 IMPLANT
DIFFUSER DRILL AIR PNEUMATIC (MISCELLANEOUS) ×3 IMPLANT
DRAPE C-ARM 42X72 X-RAY (DRAPES) ×6 IMPLANT
DRAPE LAPAROTOMY 100X72X124 (DRAPES) ×3 IMPLANT
DRAPE POUCH INSTRU U-SHP 10X18 (DRAPES) ×3 IMPLANT
DRAPE SURG 17X23 STRL (DRAPES) ×3 IMPLANT
DRILL PLIF MAS DISP 5.5MM (DRILL) ×3
DRSG OPSITE POSTOP 4X6 (GAUZE/BANDAGES/DRESSINGS) ×3 IMPLANT
DURAPREP 26ML APPLICATOR (WOUND CARE) ×3 IMPLANT
ELECT REM PT RETURN 9FT ADLT (ELECTROSURGICAL) ×3
ELECTRODE REM PT RTRN 9FT ADLT (ELECTROSURGICAL) ×1 IMPLANT
EVACUATOR 1/8 PVC DRAIN (DRAIN) IMPLANT
GAUZE SPONGE 4X4 16PLY XRAY LF (GAUZE/BANDAGES/DRESSINGS) IMPLANT
GLOVE BIO SURGEON STRL SZ7 (GLOVE) ×3 IMPLANT
GLOVE BIO SURGEON STRL SZ8 (GLOVE) ×6 IMPLANT
GLOVE BIOGEL PI IND STRL 7.0 (GLOVE) ×4 IMPLANT
GLOVE BIOGEL PI IND STRL 7.5 (GLOVE) ×2 IMPLANT
GLOVE BIOGEL PI INDICATOR 7.0 (GLOVE) ×8
GLOVE BIOGEL PI INDICATOR 7.5 (GLOVE) ×4
GOWN STRL REUS W/ TWL LRG LVL3 (GOWN DISPOSABLE) ×2 IMPLANT
GOWN STRL REUS W/ TWL XL LVL3 (GOWN DISPOSABLE) ×2 IMPLANT
GOWN STRL REUS W/TWL 2XL LVL3 (GOWN DISPOSABLE) IMPLANT
GOWN STRL REUS W/TWL LRG LVL3 (GOWN DISPOSABLE) ×4
GOWN STRL REUS W/TWL XL LVL3 (GOWN DISPOSABLE) ×4
HEMOSTAT POWDER KIT SURGIFOAM (HEMOSTASIS) ×3 IMPLANT
KIT BASIN OR (CUSTOM PROCEDURE TRAY) ×3 IMPLANT
KIT ROOM TURNOVER OR (KITS) ×3 IMPLANT
MILL MEDIUM DISP (BLADE) ×3 IMPLANT
NEEDLE HYPO 25X1 1.5 SAFETY (NEEDLE) ×3 IMPLANT
NS IRRIG 1000ML POUR BTL (IV SOLUTION) ×3 IMPLANT
OIL CARTRIDGE MAESTRO DRILL (MISCELLANEOUS) ×3
PACK LAMINECTOMY NEURO (CUSTOM PROCEDURE TRAY) ×3 IMPLANT
PAD ARMBOARD 7.5X6 YLW CONV (MISCELLANEOUS) ×9 IMPLANT
ROD 35MM (Rod) ×6 IMPLANT
SCREW LOCK (Screw) ×8 IMPLANT
SCREW LOCK FXNS SPNE MAS PL (Screw) ×4 IMPLANT
SCREW PLIF MAS 5.5X35 LUMBAR (Screw) ×12 IMPLANT
SPONGE LAP 4X18 X RAY DECT (DISPOSABLE) IMPLANT
SPONGE SURGIFOAM ABS GEL 100 (HEMOSTASIS) ×3 IMPLANT
STRIP CLOSURE SKIN 1/2X4 (GAUZE/BANDAGES/DRESSINGS) ×2 IMPLANT
SUT VIC AB 0 CT1 18XCR BRD8 (SUTURE) ×1 IMPLANT
SUT VIC AB 0 CT1 8-18 (SUTURE) ×2
SUT VIC AB 2-0 CP2 18 (SUTURE) ×6 IMPLANT
SUT VIC AB 3-0 SH 8-18 (SUTURE) ×6 IMPLANT
TOWEL GREEN STERILE (TOWEL DISPOSABLE) ×3 IMPLANT
TOWEL GREEN STERILE FF (TOWEL DISPOSABLE) ×3 IMPLANT
TRAY FOLEY W/METER SILVER 16FR (SET/KITS/TRAYS/PACK) ×3 IMPLANT
WATER STERILE IRR 1000ML POUR (IV SOLUTION) ×3 IMPLANT

## 2017-06-12 NOTE — H&P (Signed)
Subjective: Patient is a 61 y.o. female admitted for PLIF. Onset of symptoms was several months ago, gradually worsening since that time.  The pain is rated severe, unremitting, and is located at the across the lower back and radiates to legs. The pain is described as aching and occurs all day. The symptoms have been progressive. Symptoms are exacerbated by exercise. MRI or CT showed spondylolisthesis L4-5 below L3-4 fusion   Past Medical History:  Diagnosis Date  . Anxiety   . Arthritis    OA,- hands (RA)& back    . Constipation   . Diabetes mellitus without complication (HCC)    type 2  . Fibromyalgia    takes cymbalta and gabapentin  . GERD (gastroesophageal reflux disease)    takes nexium  . Headache    headaches and migraines  . Low iron    hx of - no meds now  . PONV (postoperative nausea and vomiting)   . Sleep apnea    occasionally uses CPAP    Past Surgical History:  Procedure Laterality Date  . ABDOMINAL HYSTERECTOMY    . APPENDECTOMY    . BACK SURGERY     lumbar decompression   . BILATERAL CARPAL TUNNEL RELEASE    . BLADDER SUSPENSION    . CHOLECYSTECTOMY    . COLONOSCOPY    . LUMBAR LAMINECTOMY/DECOMPRESSION MICRODISCECTOMY Right 12/01/2015   Procedure:  extraforaminal Microdiscectomy  - Right Lumbar three-lumbar four ;  Surgeon: Tia Alert, MD;  Location: [facility];  Service: Neurosurgery;  Laterality: Right;  . TRANSFORAMINAL LUMBAR INTERBODY FUSION (TLIF) WITH PEDICLE SCREW FIXATION 1 LEVEL Right 03/13/2016   Procedure: Right LUMBAR THREE- FOUR TRANSFORAMINAL LUMBAR INTERBODY FUSION (TLIF) WITH PEDICLE SCREW FIXATION ;  Surgeon: Tia Alert, MD;  Location: [facility];  Service: Neurosurgery;  Laterality: Right;  . TRIGGER FINGER RELEASE Right    index finger    Prior to Admission medications   Medication Sig Start Date End Date Taking? Authorizing Provider  busPIRone (BUSPAR) 5 MG tablet Take 5 mg by mouth 2 (two) times daily.    Yes [provider]  canagliflozin (INVOKANA) 300 MG TABS tablet Take 300 mg by mouth daily before breakfast.   Yes [provider]  colesevelam (WELCHOL) 625 MG tablet Take 1,875 mg by mouth 2 (two) times daily with a meal.   Yes [provider]  cyclobenzaprine (FLEXERIL) 10 MG tablet Take 1 tablet (10 mg total) by mouth 3 (three) times daily as needed for muscle spasms. 03/15/16  Yes Tia Alert, MD  diclofenac (VOLTAREN) 50 MG EC tablet Take 50 mg by mouth 2 (two) times daily.   Yes [provider]  DULoxetine (CYMBALTA) 60 MG capsule Take 60 mg by mouth 2 (two) times daily.    Yes [provider]  esomeprazole (NEXIUM) 40 MG capsule Take 40 mg by mouth daily before breakfast.    Yes [provider]  furosemide (LASIX) 40 MG tablet Take 40-80 mg by mouth daily as needed for fluid.    Yes [provider]  gabapentin (NEURONTIN) 400 MG capsule Take 800-1,200 mg by mouth 2 (two) times daily. 800 mg in am and 1200 mg at night   Yes [provider]  HYDROcodone-acetaminophen (NORCO) 10-325 MG tablet Take 1 tablet by mouth 2 (two) times daily as needed for moderate pain.   Yes [provider]  insulin glargine (LANTUS) 100 UNIT/ML injection Inject 40 Units into the skin at bedtime.  Yes [provider]  Liraglutide (VICTOZA) 18 MG/3ML SOPN Inject 1.8 mg into the skin at bedtime.    Yes [provider]  loratadine (CLARITIN) 10 MG tablet Take 10 mg by mouth daily.   Yes [provider]  metFORMIN (GLUCOPHAGE) 500 MG tablet Take 500 mg by mouth 3 (three) times daily.   Yes [provider]  topiramate (TOPAMAX) 25 MG tablet Take 50 mg by mouth daily.    Yes [provider]  traMADol (ULTRAM) 50 MG tablet Take 50 mg by mouth every 8 (eight) hours as needed for moderate pain.   Yes [provider]  fluticasone (FLONASE) 50 MCG/ACT nasal spray Place 2 sprays into the nose daily as needed  for allergies. 11/19/16   [provider]  promethazine (PHENERGAN) 25 MG tablet Take 25 mg by mouth daily as needed for nausea or vomiting.    [provider]  SUMAtriptan (IMITREX) 100 MG tablet Take 100 mg by mouth every 2 (two) hours as needed for migraine.     [provider]   Allergies  Allergen Reactions  . Statins Nausea And Vomiting, Rash and Other (See Comments)    MYALGIAS  . Cephalexin Nausea Only and Rash    Social History  Substance Use Topics  . Smoking status: Never Smoker  . Smokeless tobacco: Never Used  . Alcohol use Yes     Comment: maybe once a year    Family History  Problem Relation Age of Onset  . Cancer Mother   . Heart attack Father      Review of Systems  Positive ROS: neg  All other systems have been reviewed and were otherwise negative with the exception of those mentioned in the HPI and as above.  Objective: Vital signs in last 24 hours: Temp:  [98.5 F (36.9 C)] 98.5 F (36.9 C) (07/05 0958) Pulse Rate:  [98] 98 (07/05 0958) Resp:  [20] 20 (07/05 0958) BP: (110)/(61) 110/61 (07/05 0958) SpO2:  [96 %] 96 % (07/05 0958) Weight:  [101.2 kg (223 lb)] 101.2 kg (223 lb) (07/05 0958)  General Appearance: Alert, cooperative, no distress, appears stated age Head: Normocephalic, without obvious abnormality, atraumatic Eyes: PERRL, conjunctiva/corneas clear, EOM's intact    Neck: Supple, symmetrical, trachea midline Back: Symmetric, no curvature, ROM normal, no CVA tenderness Lungs:  respirations unlabored Heart: Regular rate and rhythm Abdomen: Soft, non-tender Extremities: Extremities normal, atraumatic, no cyanosis or edema Pulses: 2+ and symmetric all extremities Skin: Skin color, texture, turgor normal, no rashes or lesions  NEUROLOGIC:   Mental status: Alert and oriented x4,  no aphasia, good attention span, fund of knowledge, and memory Motor Exam - grossly normal Sensory Exam - grossly normal Reflexes:  1+ Coordination - grossly normal Gait - grossly normal Balance - grossly normal Cranial Nerves: I: smell Not tested  II: visual acuity  OS: nl    OD: nl  II: visual fields Full to confrontation  II: pupils Equal, round, reactive to light  III,VII: ptosis None  III,IV,VI: extraocular muscles  Full ROM  V: mastication Normal  V: facial light touch sensation  Normal  V,VII: corneal reflex  Present  VII: facial muscle function - upper  Normal  VII: facial muscle function - lower Normal  VIII: hearing Not tested  IX: soft palate elevation  Normal  IX,X: gag reflex Present  XI: trapezius strength  5/5  XI: sternocleidomastoid strength 5/5  XI: neck flexion strength  5/5  XII: tongue strength  Normal    Data Review Lab Results  Component Value Date   WBC 9.2 06/09/2017   HGB 13.3 06/09/2017   HCT 42.2 06/09/2017   MCV 87.2 06/09/2017   PLT 235 06/09/2017   Lab Results  Component Value Date   NA 139 06/09/2017   K 4.0 06/09/2017   CL 104 06/09/2017   CO2 24 06/09/2017   BUN 11 06/09/2017   CREATININE 0.83 06/09/2017   GLUCOSE 140 (H) 06/09/2017   Lab Results  Component Value Date   INR 0.97 06/09/2017    Assessment/Plan: Patient admitted for PLIF L4-5. Patient has failed a reasonable attempt at conservative therapy.  I explained the condition and procedure to the patient and answered any questions.  Patient wishes to proceed with procedure as planned. Understands risks/ benefits and typical outcomes of procedure.   Lezlee Gills S 06/12/2017 11:28 AM

## 2017-06-12 NOTE — Progress Notes (Signed)
Pharmacy Antibiotic Note  SHAKALA MARLATT is a 61 y.o. female admitted on 06/12/2017 with surgical prophylaxis.  Pharmacy has been consulted for vancomycin dosing for one dose 12hr post op. No drain noted. Pre-op dose vancomycin 1500mg  dose given at 1550. Scr 0.83 on 07/02  Plan: Vancomycin 1gm IV x1 (12hr post op) Pharmacy will sign off consult  Height: 5\' 4"  (162.6 cm) Weight: 223 lb (101.2 kg) IBW/kg (Calculated) : 54.7  Temp (24hrs), Avg:98 F (36.7 C), Min:97.8 F (36.6 C), Max:98.5 F (36.9 C)   Recent Labs Lab 06/09/17 0909  WBC 9.2  CREATININE 0.83    Estimated Creatinine Clearance: 84.4 mL/min (by C-G formula based on SCr of 0.83 mg/dL).    Allergies  Allergen Reactions  . Statins Nausea And Vomiting, Rash and Other (See Comments)    MYALGIAS  . Cephalexin Nausea Only and Rash      [place]  PharmD Candidate 06/12/2017 8:01 PM    I agree with the student's assessments and findings as above.   Evette Cristal, PharmD, BCPS Clinical Pharmacist 06/12/2017 8:19 PM

## 2017-06-12 NOTE — Op Note (Signed)
06/12/2017  6:42 PM  PATIENT:  Gabrielle Esparza  61 y.o. 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 Decompressive lumbar laminectomy L4-5 requiring more work than would be required for a simple exposure of the disk for PLIF in order to adequately decompress the neural elements and address the spinal stenosis 2. Posterior lumbar interbody fusion L4-5 using PEEK interbody cages packed with morcellized allograft and autograft 3. Posterior fixation L4-5 using nuvasive cortical pedicle screws. Removal of nonsegmental fixation L3-4 4. Intertransverse arthrodesis L4-5 using morcellized autograft and allograft.  SURGEON:  Marikay Alar, MD  ASSISTANTS: Dr. Venetia Maxon  ANESTHESIA:  General  EBL: 150 ml  Total I/O In: 1500 [I.V.:1000; IV Piggyback:500] Out: 255 [Urine:105; Blood:150]  BLOOD ADMINISTERED:none  DRAINS: none   INDICATION FOR PROCEDURE: This patient presented with back and leg pain. Imaging revealed adjacent level stenosis with spondylolisthesis L4-5. The patient tried a reasonable attempt at conservative medical measures without relief. I recommended decompression and instrumented fusion to address the stenosis as well as the segmental stability.  Patient understood the risks, benefits, and alternatives and potential outcomes and wished to proceed.  PROCEDURE DETAILS:  The patient was brought to the operating room. After induction of generalized endotracheal anesthesia the patient was rolled into the prone position on chest rolls and all pressure points were padded. The patient's lumbar region was cleaned and then prepped with DuraPrep and draped in the usual sterile fashion. Anesthesia was injected and then a dorsal midline incision was made and carried down to the lumbosacral fascia. The fascia was opened and the paraspinous musculature was taken down in a subperiosteal  fashion to expose L4-5 as well as the previously placed hardware. The locking caps were removed and the rods were removed. I felt like the L4 screws were somewhat loose and therefore they were also removed. A self-retaining retractor was placed. Intraoperative fluoroscopy confirmed my level, and  I then turned my attention to the decompression and complete lumbar laminectomies, hemi- facetectomies, and foraminotomies were performed at L4-5. The patient had significant spinal stenosis and this required more work than would be required for a simple exposure of the disc for posterior lumbar interbody fusion. Much more generous decompression was undertaken in order to adequately decompress the neural elements and address the patient's leg pain. The yellow ligament was removed to expose the underlying dura and nerve roots, and generous foraminotomies were performed to adequately decompress the neural elements. Both the exiting and traversing nerve roots were decompressed on both sides until a coronary dilator passed easily along the nerve roots. Once the decompression was complete, I turned my attention to the posterior lower lumbar interbody fusion. The epidural venous vasculature was coagulated and cut sharply. Disc space was incised and the initial discectomy was performed with pituitary rongeurs. The disc space was distracted with sequential distractors to a height of 10 mm. We then used a series of scrapers and shavers to prepare the endplates for fusion. The midline was prepared with Epstein curettes. Once the complete discectomy was finished, we packed an appropriate sized peek interbody cage with local autograft and morcellized allograft, gently retracted the nerve root, and tapped the cage into position at L4-5.  The midline between the cages was packed with morselized autograft and allograft. We then turned our attention to the placement of the pedicle screws. 5.5 x 35 mm pedicle screws were placed in the  pedicles of L4 and had a good tight fit and bite. The pedicle screw entry zones of L5 were identified utilizing surface landmarks and fluoroscopy. I drilled into each pedicle utilizing the hand drill , and tapped each pedicle with the appropriate tap. We palpated with a ball probe to assure no break in the cortex. We then placed 5.5 x 35 mm cortical pedicle screws into the pedicles bilaterally at L5. We then decorticated the transverse processes and laid a mixture of morcellized autograft and allograft out over these to perform intertransverse arthrodesis at L45. We then placed lordotic rods into the multiaxial screw heads of the pedicle screws and locked these in position with the locking caps and anti-torque device. We then checked our construct with AP and lateral fluoroscopy. Irrigated with copious amounts of bacitracin-containing saline solution. Inspected the nerve roots once again to assure adequate decompression, lined to the dura with Gelfoam, and closed the muscle and the fascia with 0 Vicryl. Closed the subcutaneous tissues with 2-0 Vicryl and subcuticular tissues with 3-0 Vicryl. The skin was closed with benzoin and Steri-Strips. Dressing was then applied, the patient was awakened from general anesthesia and transported to the recovery room in stable condition. At the end of the procedure all sponge, needle and instrument counts were correct.   PLAN OF CARE: admit to inpatient  PATIENT DISPOSITION:  PACU - hemodynamically stable.   Delay start of Pharmacological VTE agent (>24hrs) due to surgical blood loss or risk of bleeding:  yes

## 2017-06-12 NOTE — Anesthesia Postprocedure Evaluation (Signed)
Anesthesia Post Note  Patient: Gabrielle Esparza  Procedure(s) Performed: Procedure(s) (LRB): LUMBAR FOUR-FIVE POSTERIOR LUMBAR INTERBODY FUSION WITH EXTENSION OF EXISTING FUSION (N/A)     Patient location during evaluation: PACU Anesthesia Type: General Level of consciousness: awake, awake and alert and oriented Pain management: pain level controlled Vital Signs Assessment: post-procedure vital signs reviewed and stable Respiratory status: spontaneous breathing, nonlabored ventilation and respiratory function stable Cardiovascular status: blood pressure returned to baseline Anesthetic complications: no    Last Vitals:  Vitals:   06/12/17 1932 06/12/17 1959  BP: 126/64 (!) 112/56  Pulse: 97 88  Resp: 18 18  Temp: 36.6 C 36.6 C    Last Pain:  Vitals:   06/12/17 2020  TempSrc:   PainSc: 10-Worst pain ever                 Cordia Miklos COKER

## 2017-06-12 NOTE — Anesthesia Procedure Notes (Addendum)
Procedure Name: Intubation Date/Time: 06/12/2017 3:47 PM Performed by: Julian Reil Pre-anesthesia Checklist: Patient identified, Emergency Drugs available, Suction available, Patient being monitored and Timeout performed Patient Re-evaluated:Patient Re-evaluated prior to inductionOxygen Delivery Method: Circle system utilized Preoxygenation: Pre-oxygenation with 100% oxygen Intubation Type: IV induction Ventilation: Mask ventilation without difficulty Laryngoscope Size: Miller and 3 Grade View: Grade II Tube type: Oral Tube size: 7.0 mm Number of attempts: 1 Airway Equipment and Method: Stylet Placement Confirmation: ETT inserted through vocal cords under direct vision,  positive ETCO2 and breath sounds checked- equal and bilateral Secured at: 21 cm Tube secured with: Tape Dental Injury: Teeth and Oropharynx as per pre-operative assessment  Comments: Upper front crowns as pre-op after DL.  Large floppy epiglottis noted, phlegm noted at glottic opening, ETT passed thru vocal cords with slight resistance. 4x4s bite block used.

## 2017-06-12 NOTE — Transfer of Care (Signed)
Immediate Anesthesia Transfer of Care Note  Patient: Gabrielle Esparza  Procedure(s) Performed: Procedure(s): LUMBAR FOUR-FIVE POSTERIOR LUMBAR INTERBODY FUSION WITH EXTENSION OF EXISTING FUSION (N/A)  Patient Location: PACU  Anesthesia Type:General  Level of Consciousness: awake and patient cooperative  Airway & Oxygen Therapy: Patient Spontanous Breathing and Patient connected to nasal cannula oxygen  Post-op Assessment: Report given to RN and Post -op Vital signs reviewed and stable  Post vital signs: Reviewed and stable  Last Vitals:  Vitals:   06/12/17 0958 06/12/17 1847  BP: 110/61 131/68  Pulse: 98 (!) 109  Resp: 20 18  Temp: 36.9 C 36.6 C    Last Pain:  Vitals:   06/12/17 1847  PainSc: (P) Asleep      Patients Stated Pain Goal: 3 (06/12/17 0938)  Complications: No apparent anesthesia complications

## 2017-06-12 NOTE — Anesthesia Preprocedure Evaluation (Addendum)
Anesthesia Evaluation  Patient identified by MRN, date of birth, ID band Patient awake    Reviewed: Allergy & Precautions, NPO status , Patient's Chart, lab work & pertinent test results  History of Anesthesia Complications (+) PONV and history of anesthetic complications  Airway Mallampati: III  TM Distance: >3 FB Neck ROM: Full    Dental no notable dental hx. (+) Caps, Teeth Intact, Dental Advisory Given   Pulmonary sleep apnea and Continuous Positive Airway Pressure Ventilation ,    Pulmonary exam normal breath sounds clear to auscultation       Cardiovascular hypertension, Pt. on medications Normal cardiovascular exam Rhythm:Regular Rate:Normal     Neuro/Psych  Headaches, Anxiety    GI/Hepatic Neg liver ROS, GERD  Medicated and Controlled,  Endo/Other  diabetes, Well Controlled, Type 2, Insulin Dependent, Oral Hypoglycemic AgentsMorbid obesity  Renal/GU negative Renal ROS     Musculoskeletal  (+) Arthritis , Osteoarthritis and Rheumatoid disorders,  Fibromyalgia -Spondylolisthesis L4-L5   Abdominal (+) + obese,   Peds  Hematology   Anesthesia Other Findings   Reproductive/Obstetrics                            Anesthesia Physical Anesthesia Plan  ASA: III  Anesthesia Plan: General   Post-op Pain Management:    Induction: Intravenous  PONV Risk Score and Plan: 4 or greater and Ondansetron, Dexamethasone, Propofol, Midazolam and Scopolamine patch - Pre-op  Airway Management Planned: Oral ETT  Additional Equipment:   Intra-op Plan:   Post-operative Plan: Extubation in OR  Informed Consent: I have reviewed the patients History and Physical, chart, labs and discussed the procedure including the risks, benefits and alternatives for the proposed anesthesia with the patient or authorized representative who has indicated his/her understanding and acceptance.   Dental advisory  given  Plan Discussed with: CRNA, Anesthesiologist and Surgeon  Anesthesia Plan Comments:         Anesthesia Quick Evaluation

## 2017-06-13 LAB — GLUCOSE, CAPILLARY: GLUCOSE-CAPILLARY: 166 mg/dL — AB (ref 65–99)

## 2017-06-13 MED ORDER — HYDROCODONE-ACETAMINOPHEN 10-325 MG PO TABS: 1.0000 | ORAL_TABLET | Freq: Four times a day (QID) | ORAL | 0 refills | Status: DC | PRN

## 2017-06-13 NOTE — Progress Notes (Signed)
OT Cancellation Note and Discharge  Patient Details Name: Gabrielle Esparza MRN: [id number] DOB: 02-Feb-1956   Cancelled Treatment:    Reason Eval/Treat Not Completed: After consulting Pt and PT earlier today, Pt able to maintain back precautions, and will have 24 hour assist from family. OT screened, no needs identified, will sign off at this time. Thank you for this referral.  Emelda Fear 06/13/2017, 2:19 PM  Sherryl Manges OTR/L [phone]

## 2017-06-13 NOTE — Progress Notes (Signed)
Patient's foley removed   Patient walked from bed to chair, felt that is all she could walk at this time   Patient sitting in chair with brace on

## 2017-06-13 NOTE — Care Management Note (Signed)
Case Management Note  Patient Details  Name: Gabrielle Esparza MRN: [id number] Date of Birth: 1956-08-01  Subjective/Objective:                    Action/Plan: Pt discharging home with orders walker and 3 in 1. Pt states she has this equipment at home. Pt has PCP, insurance and transportation home. No further needs per CM.   Expected Discharge Date:  06/13/17               Expected Discharge Plan:  Home/Self Care  In-House Referral:     Discharge planning Services     Post Acute Care Choice:    Choice offered to:     DME Arranged:    DME Agency:     HH Arranged:    HH Agency:     Status of Service:  Completed, signed off  If discussed at [employer] of Stay Meetings, dates discussed:    Additional Comments:  Kermit Balo, RN 06/13/2017, 11:29 AM

## 2017-06-13 NOTE — Progress Notes (Signed)
Pt being discharged from hospital per orders from MD. Pt educated on discharge instructions. Pt verbalized understanding of discharge instructions. All questions and concerns were addressed. Pt's IV was removed prior to discharge. Pt exited hospital via wheelchair. 

## 2017-06-13 NOTE — Evaluation (Addendum)
Physical Therapy Evaluation and Discharge Patient Details Name: Gabrielle Esparza MRN: [id number] DOB: [birth year] Today's Date: 06/13/2017   History of Present Illness  Pt is a 61 y/o female s/p PLIF L4-L5. PMH including but not limited to DM, fibromyalgia, L3-L4 microdiscectomy in 2016 and TLIF L3-L4 in 2017.  Clinical Impression  Pt presented supine in bed with HOB elevated, awake and willing to participate in therapy session. Prior to admission, pt reported that she ambulated independently and was independent with ADLs. Pt ambulated in hallway without use of an AD with min guard for safety. PT reviewed 3/3 back precautions with pt throughout session as well as the log roll technique. No further acute PT needs identified at this time. Pt is planning to d/c home today. PT signing off.    Follow Up Recommendations No PT follow up    Equipment Recommendations  None recommended by PT    Recommendations for Other Services       Precautions / Restrictions Precautions Precautions: Back Precaution Comments: Pt able to recall 2/3 back precautions. PT reviewed all precautions and log roll technique with pt. Required Braces or Orthoses: Spinal Brace Spinal Brace: Lumbar corset;Applied in sitting position Restrictions Weight Bearing Restrictions: No      Mobility  Bed Mobility Overal bed mobility: Needs Assistance Bed Mobility: Rolling;Sidelying to Sit;Sit to Sidelying Rolling: Min guard Sidelying to sit: Min guard     Sit to sidelying: Min guard General bed mobility comments: vc'ing for log roll, use of bed rails, HOB flat, min guard for safety  Transfers Overall transfer level: Needs assistance Equipment used: None Transfers: Sit to/from Stand Sit to Stand: Supervision         General transfer comment: increased time, supervision for safety  Ambulation/Gait Ambulation/Gait assistance: Min guard Ambulation Distance (Feet): 150 Feet Assistive device: None Gait  Pattern/deviations: Step-through pattern;Decreased stride length Gait velocity: decreased Gait velocity interpretation: Below normal speed for age/gender General Gait Details: mild instability but no LOB or need for physical assistance, min guard for safety, occasionally reaching for hand rails in hallway  Stairs            Wheelchair Mobility    Modified Rankin (Stroke Patients Only)       Balance Overall balance assessment: Needs assistance Sitting-balance support: Feet supported Sitting balance-Leahy Scale: Normal     Standing balance support: During functional activity;No upper extremity supported Standing balance-Leahy Scale: Good                               Pertinent Vitals/Pain Pain Assessment: 0-10 Pain Score: 2  Pain Location: low back Pain Descriptors / Indicators: Sore Pain Intervention(s): Monitored during session;Repositioned    Home Living Family/patient expects to be discharged to:: Private residence Living Arrangements: Spouse/significant other;Other relatives Available Help at Discharge: Family;Available 24 hours/day Type of Home: House Home Access: Ramped entrance     Home Layout: One level Home Equipment: Shower seat;Bedside commode;Cane - single point;Walker - 2 wheels      Prior Function Level of Independence: Independent               Hand Dominance   Dominant Hand: Right    Extremity/Trunk Assessment   Upper Extremity Assessment Upper Extremity Assessment: Overall WFL for tasks assessed    Lower Extremity Assessment Lower Extremity Assessment: Generalized weakness    Cervical / Trunk Assessment Cervical / Trunk Assessment: Kyphotic  Communication   Communication: No  difficulties  Cognition Arousal/Alertness: Awake/alert Behavior During Therapy: WFL for tasks assessed/performed Overall Cognitive Status: Within Functional Limits for tasks assessed                                         General Comments      Exercises     Assessment/Plan    PT Assessment Patent does not need any further PT services  PT Problem List         PT Treatment Interventions      PT Goals (Current goals can be found in the Care Plan section)  Acute Rehab PT Goals Patient Stated Goal: return home    Frequency     Barriers to discharge        Co-evaluation               AM-PAC PT "6 Clicks" Daily Activity  Outcome Measure Difficulty turning over in bed (including adjusting bedclothes, sheets and blankets)?: A Little Difficulty moving from lying on back to sitting on the side of the bed? : A Little Difficulty sitting down on and standing up from a chair with arms (e.g., wheelchair, bedside commode, etc,.)?: A Little Help needed moving to and from a bed to chair (including a wheelchair)?: A Little Help needed walking in hospital room?: A Little Help needed climbing 3-5 steps with a railing? : A Little 6 Click Score: 18    End of Session Equipment Utilized During Treatment: Gait belt;Back brace Activity Tolerance: Patient tolerated treatment well Patient left: in bed;with call bell/phone within reach;with bed alarm set;with family/visitor present Nurse Communication: Mobility status PT Visit Diagnosis: Other abnormalities of gait and mobility (R26.89);Pain Pain - part of body:  (back)    Time: 0623-7628 PT Time Calculation (min) (ACUTE ONLY): 18 min   Charges:   PT Evaluation $PT Eval Moderate Complexity: 1 Procedure     PT G Codes:        Deborah Chalk, PT, DPT [phone]   Alessandra Bevels Jakhiya Brower 06/13/2017, 10:08 AM

## 2017-06-13 NOTE — Discharge Summary (Signed)
Physician Discharge Summary  Patient ID: Gabrielle Esparza MRN: [id number] DOB/AGE: 06/20/1956 61 y.o.  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 The patient tolerated the procedure well and was taken to the recovery room and then to the floor in stable condition. The hospital course was routine. There were no complications. The wound remained clean dry and intact. Pt had appropriate back soreness. No complaints of leg pain or new N/T/W. The patient remained afebrile with stable vital signs, and tolerated a regular diet. The patient continued to increase activities, and pain was well controlled with oral pain medications.   Consults: None  Significant Diagnostic Studies:  Results for orders placed or performed during the hospital encounter of 06/12/17  Glucose, capillary  Result Value Ref Range   Glucose-Capillary 112 (H) 65 - 99 mg/dL  Glucose, capillary  Result Value Ref Range   Glucose-Capillary 82 65 - 99 mg/dL  Glucose, capillary  Result Value Ref Range   Glucose-Capillary 89 65 - 99 mg/dL  Glucose, capillary  Result Value Ref Range   Glucose-Capillary 125 (H) 65 - 99 mg/dL  Glucose, capillary  Result Value Ref Range   Glucose-Capillary 166 (H) 65 - 99 mg/dL   Comment 1 Notify RN    Comment 2 Document in Chart     Chest 2 View  Result Date: 06/09/2017 CLINICAL DATA:  Preoperative chest radiograph. EXAM: CHEST  2 VIEW COMPARISON:  11/29/2015 FINDINGS: Cardiomediastinal silhouette is normal. Mediastinal contours appear intact. There is no evidence of focal airspace consolidation, pleural effusion or pneumothorax. Osseous structures are without acute abnormality. Soft tissues are grossly normal. IMPRESSION: No active  cardiopulmonary disease. Electronically Signed   By: Ted Mcalpine M.D.   On: 06/09/2017 09:41   Dg Lumbar Spine 2-3 Views  Result Date: 06/12/2017 CLINICAL DATA:  Posterior lumbar spine fusion. EXAM: LUMBAR SPINE - 2-3 VIEW; DG C-ARM 61-120 MIN COMPARISON:  Myelogram 05/01/2017 FINDINGS: Intraoperative fluoroscopic images from extension of lower lumbosacral spine fusion demonstrate intrapedicular screws within L3, L4 and L5. Disc spacers also seen. Fluoroscopy time is recorded as 25 second. IMPRESSION: Intraoperative fluoroscopic images from lower lumbar spine fusion. Electronically Signed   By: Ted Mcalpine M.D.   On: 06/12/2017 18:39   Dg C-arm 1-60 Min  Result Date: 06/12/2017 CLINICAL DATA:  Posterior lumbar spine fusion. EXAM: LUMBAR SPINE - 2-3 VIEW; DG C-ARM 61-120 MIN COMPARISON:  Myelogram 05/01/2017 FINDINGS: Intraoperative fluoroscopic images from extension of lower lumbosacral spine fusion demonstrate intrapedicular screws within L3, L4 and L5. Disc spacers also seen. Fluoroscopy time is recorded as 25 second. IMPRESSION: Intraoperative fluoroscopic images from lower lumbar spine fusion. Electronically Signed   By: Ted Mcalpine M.D.   On: 06/12/2017 18:39    Antibiotics:  Anti-infectives    Start     Dose/Rate Route Frequency Ordered Stop   06/13/17 0400  vancomycin (VANCOCIN) IVPB 1000 mg/200 mL premix     1,000 mg 200 mL/hr over 60 Minutes Intravenous  Once 06/12/17 2016 06/13/17 0546   06/12/17 1826  vancomycin (VANCOCIN) powder  Status:  Discontinued       As needed 06/12/17 1826 06/12/17 1843   06/12/17 1724  bacitracin 50,000 Units in sodium chloride irrigation 0.9 % 500 mL irrigation  Status:  Discontinued  As needed 06/12/17 1725 06/12/17 1843   06/12/17 1330  vancomycin (VANCOCIN) 1,500 mg in sodium chloride 0.9 % 500 mL IVPB     1,500 mg 250 mL/hr over 120 Minutes Intravenous To [facility] 06/10/17 0951 06/12/17 1650      Discharge  Exam: Blood pressure (!) 93/54, pulse 77, temperature 97.7 F (36.5 C), temperature source Oral, resp. rate 20, height 5\' 4"  (1.626 m), weight 101.2 kg (223 lb), SpO2 97 %. Neurologic: Grossly normal Dressing dry  Discharge Medications:   Allergies as of 06/13/2017      Reactions   Statins Nausea And Vomiting, Rash, Other (See Comments)   MYALGIAS   Cephalexin Nausea Only, Rash      Medication List    TAKE these medications   busPIRone 5 MG tablet Commonly known as:  BUSPAR Take 5 mg by mouth 2 (two) times daily.   colesevelam 625 MG tablet Commonly known as:  WELCHOL Take 1,875 mg by mouth 2 (two) times daily with a meal.   cyclobenzaprine 10 MG tablet Commonly known as:  FLEXERIL Take 1 tablet (10 mg total) by mouth 3 (three) times daily as needed for muscle spasms.   diclofenac 50 MG EC tablet Commonly known as:  VOLTAREN Take 50 mg by mouth 2 (two) times daily.   DULoxetine 60 MG capsule Commonly known as:  CYMBALTA Take 60 mg by mouth 2 (two) times daily.   esomeprazole 40 MG capsule Commonly known as:  NEXIUM Take 40 mg by mouth daily before breakfast.   fluticasone 50 MCG/ACT nasal spray Commonly known as:  FLONASE Place 2 sprays into the nose daily as needed for allergies.   furosemide 40 MG tablet Commonly known as:  LASIX Take 40-80 mg by mouth daily as needed for fluid.   gabapentin 400 MG capsule Commonly known as:  NEURONTIN Take 800-1,200 mg by mouth 2 (two) times daily. 800 mg in am and 1200 mg at night   HYDROcodone-acetaminophen 10-325 MG tablet Commonly known as:  NORCO Take 1-2 tablets by mouth every 6 (six) hours as needed for moderate pain. What changed:  how much to take  when to take this   insulin glargine 100 UNIT/ML injection Commonly known as:  LANTUS Inject 40 Units into the skin at bedtime.   INVOKANA 300 MG Tabs tablet Generic drug:  canagliflozin Take 300 mg by mouth daily before breakfast.   loratadine 10 MG  tablet Commonly known as:  CLARITIN Take 10 mg by mouth daily.   metFORMIN 500 MG tablet Commonly known as:  GLUCOPHAGE Take 500 mg by mouth 3 (three) times daily.   promethazine 25 MG tablet Commonly known as:  PHENERGAN Take 25 mg by mouth daily as needed for nausea or vomiting.   SUMAtriptan 100 MG tablet Commonly known as:  IMITREX Take 100 mg by mouth every 2 (two) hours as needed for migraine.   topiramate 25 MG tablet Commonly known as:  TOPAMAX Take 50 mg by mouth daily.   traMADol 50 MG tablet Commonly known as:  ULTRAM Take 50 mg by mouth every 8 (eight) hours as needed for moderate pain.   VICTOZA 18 MG/3ML Sopn Generic drug:  liraglutide Inject 1.8 mg into the skin at bedtime.            Durable Medical Equipment        Start     Ordered   06/12/17 2000  DME Walker rolling  Once    Question:  Patient  needs a walker to treat with the following condition  Answer:  S/P lumbar fusion   06/12/17 1959   06/12/17 2000  DME 3 n 1  Once     06/12/17 1959      Disposition: home   Final Dx: PLIF L4-5  Discharge Instructions     Remove dressing in 72 hours    Complete by:  As directed    Call MD for:  difficulty breathing, headache or visual disturbances    Complete by:  As directed    Call MD for:  persistant nausea and vomiting    Complete by:  As directed    Call MD for:  redness, tenderness, or signs of infection (pain, swelling, redness, odor or green/yellow discharge around incision site)    Complete by:  As directed    Call MD for:  severe uncontrolled pain    Complete by:  As directed    Call MD for:  temperature >100.4    Complete by:  As directed    Diet - low sodium heart healthy    Complete by:  As directed    Increase activity slowly    Complete by:  As directed          Signed: Clista Rainford S 06/13/2017, 7:49 AM

## 2017-06-13 NOTE — Progress Notes (Signed)
Orthopedic Tech Progress Note Patient Details:  Gabrielle Esparza 1955/12/28 [id number]  Patient ID: Gabrielle Esparza, female   DOB: 1956/03/29, 61 y.o.   MRN: [id number]   Saul Fordyce 06/13/2017, 9:04 Isaiah Blakes for Brace

## 2017-09-06 IMAGING — XA DG MYELOGRAPHY LUMBAR INJ LUMBOSACRAL
6 of 17 series · 6 of 17 positions shown · non-contrast
Comparison: CT of the lumbar spine 10/14/2016. MRI of the lumbar
spine 02/15/2016

CLINICAL DATA: Low back pain extending into the right lower
extremity. Pain is along the anterior and medial right thigh (L3)
and the lateral calf (L5). Lumbar fusion without significant relief
of pain.
TECHNIQUE: Contiguous axial images were obtained through the Lumbar spine after
the intrathecal infusion of infusion. Coronal and sagittal
reconstructions were obtained of the axial image sets.

[Series 1: vasc adipose · 1 of 1 slices shown (1 of 3)]
[im 1/1]
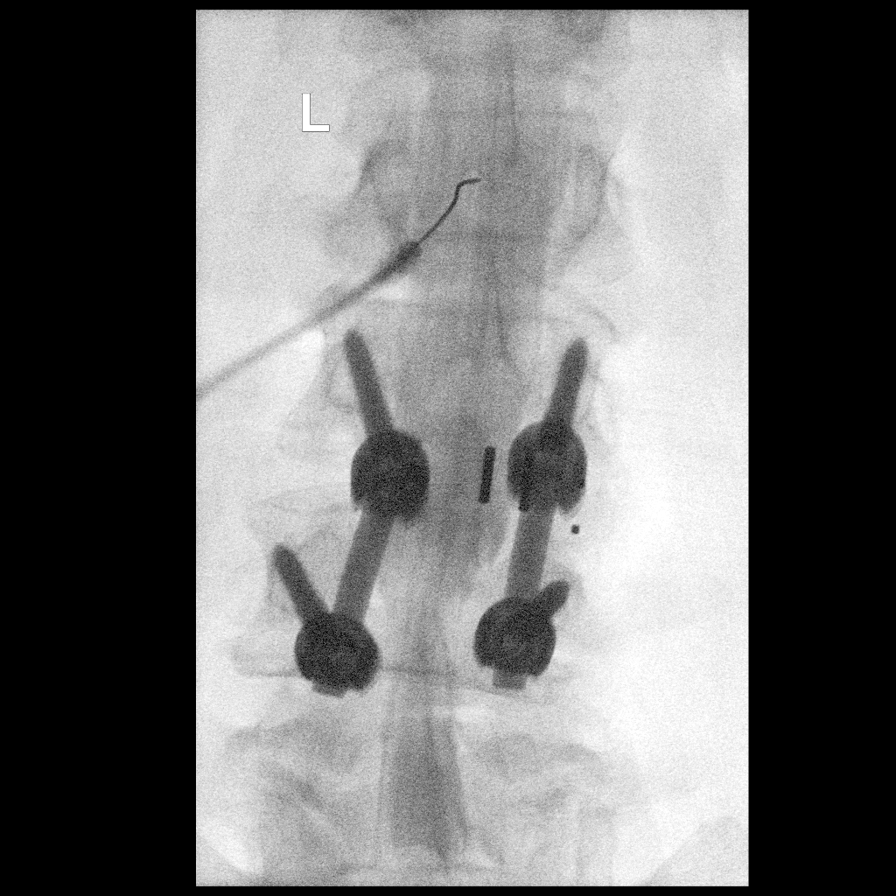

[Series 1: w lumbar spine lat · 0.15mm/px · 1 of 1 slices shown]
[im 1/1]
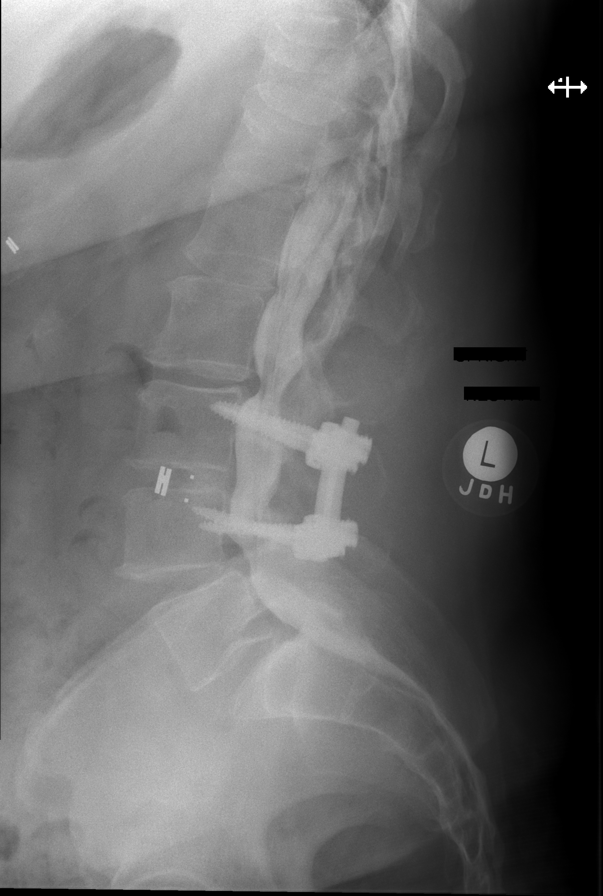

[Series 2: w lumbar spine flexion · 0.15mm/px · 1 of 1 slices shown]
[im 1/1]
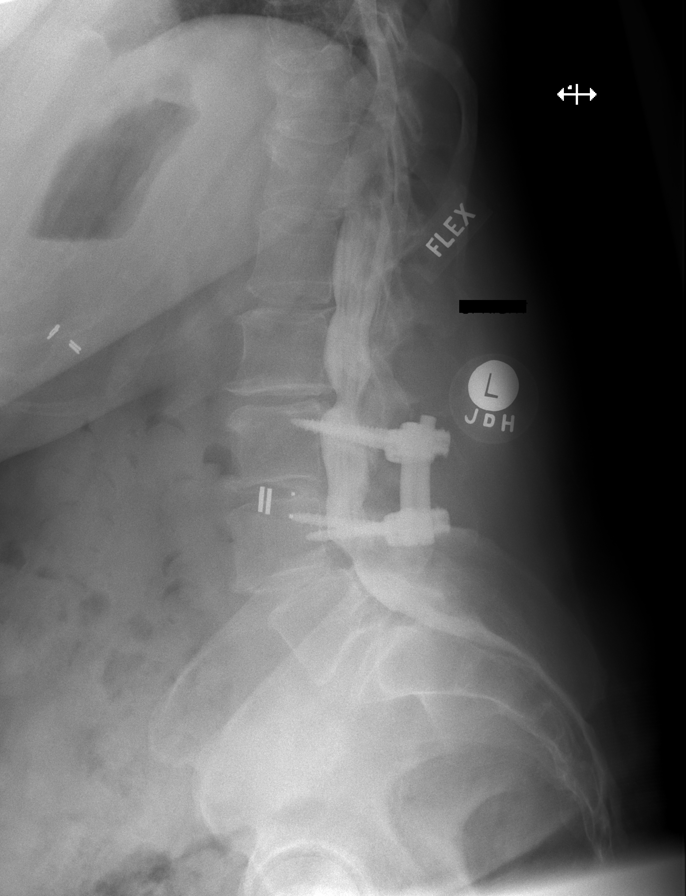

[Series 2: vasc adipose · 1 of 1 slices shown (2 of 3)]
[im 1/1]
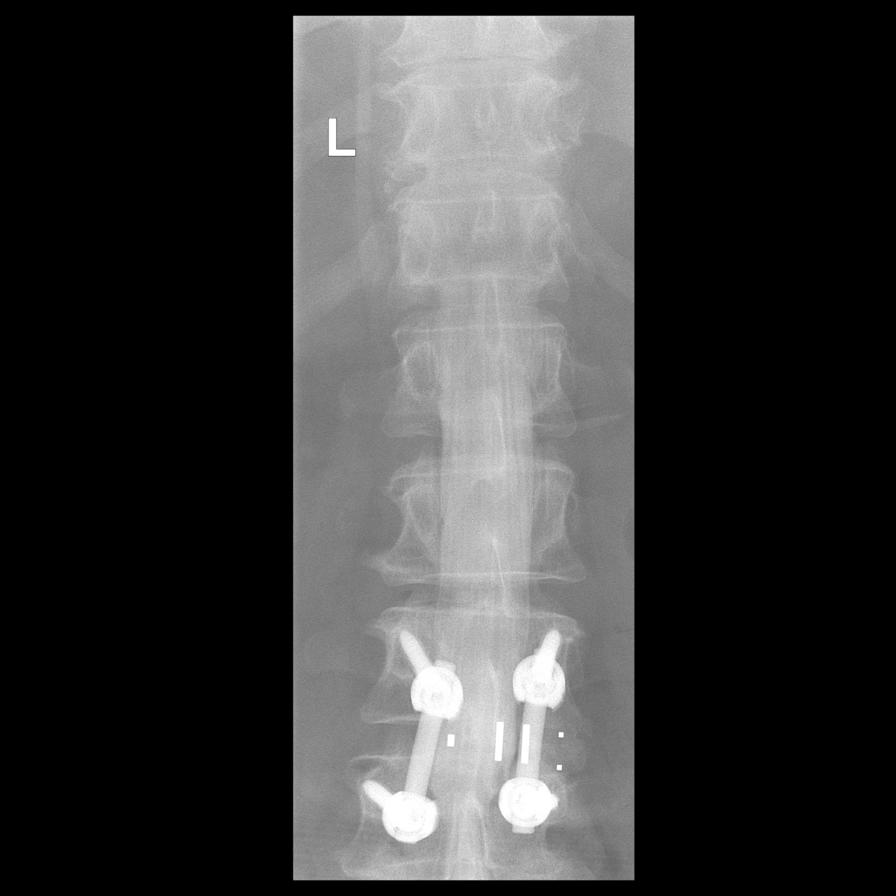

[Series 3: vasc adipose · 1 of 1 slices shown (3 of 3)]
[im 1/1]
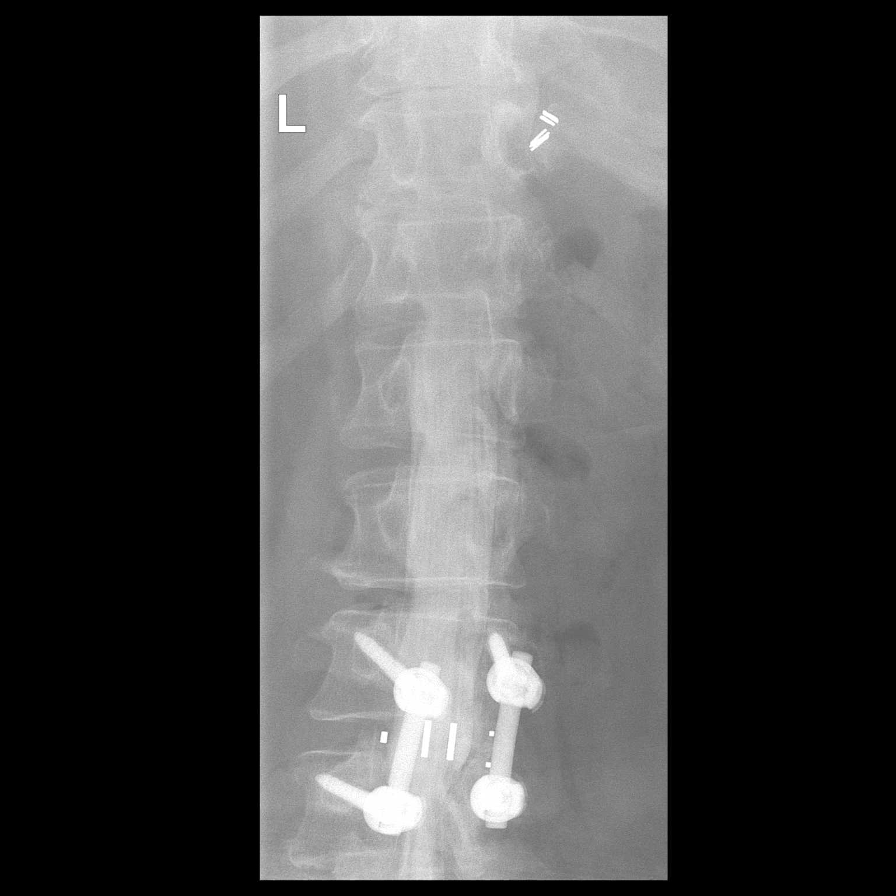

[Series 3: w lumbar spine extension · 0.15mm/px · 1 of 1 slices shown]
[im 1/1]
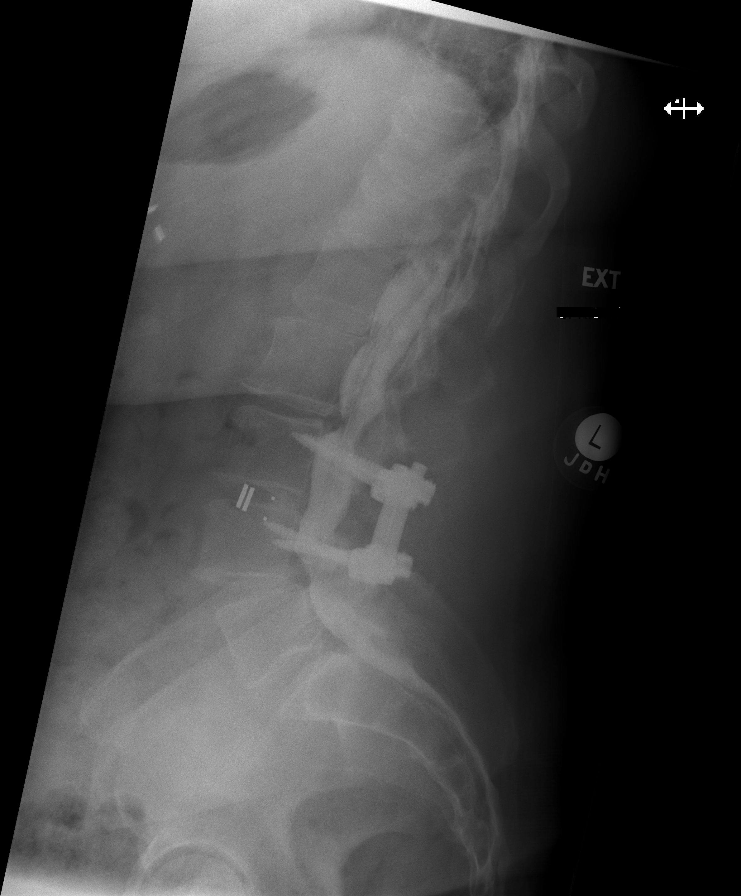

[6 of 17 positions shown; findings below may reference images not displayed]

EXAM:
LUMBAR MYELOGRAM

FLUOROSCOPY TIME:  Radiation Exposure Index (as provided by the
fluoroscopic device): 265.41 uGy*m2

Fluoroscopy Time:  18 seconds

Number of Acquired Images:  15

PROCEDURE:
After thorough discussion of risks and benefits of the procedure
including bleeding, infection, injury to nerves, blood vessels,
adjacent structures as well as headache and CSF leak, written and
oral informed consent was obtained. Consent was obtained by Dr.
Jhon Brayan Qb. Time out form was completed.

Patient was positioned prone on the fluoroscopy table. Local
anesthesia was provided with 1% lidocaine without epinephrine after
prepped and draped in the usual sterile fashion. Puncture was
performed at L1-2 using a 3 1/2 inch 22-gauge spinal needle via a
left paramedian approach. Using a single pass through the dura, the
needle was placed within the thecal sac, with return of clear CSF.
15 mL of Isovue M 200 was injected into the thecal sac, with normal
opacification of the nerve roots and cauda equina consistent with
free flow within the subarachnoid space.

I personally performed the lumbar puncture and administered the
intrathecal contrast. I also personally supervised acquisition of
the myelogram images.
FINDINGS: LUMBAR MYELOGRAM FINDINGS:

Lumbar fusion is noted at L3-4. There is slight rightward curvature
at L2-3 with mild left subarticular narrowing. Moderate central
canal stenosis is present at L4-5. There is subarticular stenosis
bilaterally and early truncation of the exiting L4 nerve roots and
traversing L5 nerve roots.

The upright images demonstrate significant increase in the disc
protrusion at L2-3, likely impacting L3 nerve roots. There is also
anterolisthesis at L4-5 and further uncovering of the L4-5 lumbar
disc. This does not change significantly with flexion or extension.
There is no motion at the fused segment, L3-4.

CT LUMBAR MYELOGRAM FINDINGS:

The lumbar spine is imaged from the midbody of T12 through the
midbody of S3. There is solid fusion at L3-4 with bridging bone on
the right. Hardware is intact with bilateral pedicle screw and rod
fixation.

Conus medullaris terminates at T12-L1.

Limited imaging of the abdomen is unremarkable. There is no
significant adenopathy.

L1-2:  Negative.

L2-3: A vacuum disc is present. A broad-based disc bulge is present.
Moderate facet hypertrophy is present bilaterally. There is no focal
stenosis on these supine images.

L3-4: Bilateral laminectomy and foraminotomies are present. No
residual or recurrent stenosis is present.

L4-5: A broad-based disc protrusion is present. Moderate facet
hypertrophy and spurring is present bilaterally. There is thickening
of the ligamentum flavum. Mild subarticular narrowing is present
bilaterally on the supine images. Moderate right and mild left
foraminal stenosis is present.

L5-S1: Advanced facet hypertrophy is present bilaterally. A vacuum
disc phenomenon is present on both sides. Mild right subarticular
and foraminal stenosis is present.
IMPRESSION: 1. Solid lumbar fusion, bilateral laminectomy and bilateral
foraminotomy at the at L4-5 without residual recurrent stenosis.
2. Adjacent level disease at L4-5 with broad-based disc protrusion
and moderate facet hypertrophy and spurring. Moderate subarticular
narrowing is present bilaterally on standing. Moderate right and
mild left foraminal stenosis is also present.
3. Anterolisthesis and further uncovering of a broad-based disc
protrusion at L4-5 is evident upon standing.
4. Adjacent level disease at L2-3 with exacerbation of a broad-based
disc protrusion upon standing. Subtle left subarticular narrowing is
present on the myelographic images, not present on the supine CT
images.
5. Advanced facet hypertrophy at L5-S1 with spurring leading to mild
right subarticular and foraminal stenosis. This does not change
significantly with standing.

## 2023-10-29 ENCOUNTER — Other Ambulatory Visit: Payer: Self-pay | Admitting: Student

## 2023-10-29 DIAGNOSIS — M4316 Spondylolisthesis, lumbar region: Secondary | ICD-10-CM

## 2023-11-25 ENCOUNTER — Ambulatory Visit
Admission: RE | Admit: 2023-11-25 | Discharge: 2023-11-25 | Disposition: A | Discharge status: Home / Self Care | Payer: Medicare HMO | Source: Ambulatory Visit | Attending: Student

## 2023-11-25 DIAGNOSIS — M4316 Spondylolisthesis, lumbar region: Secondary | ICD-10-CM

## 2023-11-25 MED ORDER — GADOPICLENOL 0.5 MMOL/ML IV SOLN
10.0000 mL | Freq: Once | INTRAVENOUS | Status: AC | PRN
  Administered 2023-11-25: 10 mL via INTRAVENOUS

## 2024-01-13 ENCOUNTER — Other Ambulatory Visit: Payer: Self-pay | Admitting: Neurological Surgery

## 2024-03-31 NOTE — Pre-Procedure Instructions (Signed)
 Surgical Instructions   Your procedure is scheduled on Monday, May 5th. Report to [facility] Main Entrance "A" at 05:30 A.M., then check in with the Admitting office. Any questions or running late day of surgery: call [phone]  Questions prior to your surgery date: call [phone], Monday-Friday, 8am-4pm. If you experience any cold or flu symptoms such as cough, fever, chills, shortness of breath, etc. between now and your scheduled surgery, please notify us  at the above number.     Remember:  Do not eat or drink after midnight the night before your surgery    Take these medicines the morning of surgery with A SIP OF WATER  busPIRone  (BUSPAR )  esomeprazole (NEXIUM)  gabapentin  (NEURONTIN )  methocarbamol (ROBAXIN)  metoprolol tartrate (LOPRESSOR)  montelukast (SINGULAIR)  PARoxetine (PAXIL)   May take these medicines IF NEEDED: traMADol (ULTRAM)    One week prior to surgery, STOP taking any Aspirin (unless otherwise instructed by your surgeon) Aleve, Naproxen, Ibuprofen, Motrin, Advil, Goody's, BC's, all herbal medications, fish oil, and non-prescription vitamins. This includes celecoxib  (CELEBREX ).   WHAT DO I DO ABOUT MY DIABETES MEDICATION?   Do not take metFORMIN  (GLUCOPHAGE -XR) OR pioglitazone (ACTOS) the morning of surgery.  THE NIGHT BEFORE SURGERY, take 15 units (50%) of insulin  glargine (LANTUS).     HOW TO MANAGE YOUR DIABETES BEFORE AND AFTER SURGERY  Why is it important to control my blood sugar before and after surgery? Improving blood sugar levels before and after surgery helps healing and can limit problems. A way of improving blood sugar control is eating a healthy diet by:  Eating less sugar and carbohydrates  Increasing activity/exercise  Talking with your doctor about reaching your blood sugar goals High blood sugars (greater than 180 mg/dL) can raise your risk of infections and slow your recovery, so you will need to focus on controlling your  diabetes during the weeks before surgery. Make sure that the doctor who takes care of your diabetes knows about your planned surgery including the date and location.  How do I manage my blood sugar before surgery? Check your blood sugar at least 4 times a day, starting 2 days before surgery, to make sure that the level is not too high or low.  Check your blood sugar the morning of your surgery when you wake up and every 2 hours until you get to the Short Stay unit.  If your blood sugar is less than 70 mg/dL, you will need to treat for low blood sugar: Do not take insulin . Treat a low blood sugar (less than 70 mg/dL) with  cup of clear juice (cranberry or apple), 4 glucose tablets, OR glucose gel. Recheck blood sugar in 15 minutes after treatment (to make sure it is greater than 70 mg/dL). If your blood sugar is not greater than 70 mg/dL on recheck, call [phone] for further instructions. Report your blood sugar to the short stay nurse when you get to Short Stay.  If you are admitted to the hospital after surgery: Your blood sugar will be checked by the staff and you will probably be given insulin  after surgery (instead of oral diabetes medicines) to make sure you have good blood sugar levels. The goal for blood sugar control after surgery is 80-180 mg/dL.                    Do NOT Smoke (Tobacco/Vaping) for 24 hours prior to your procedure.  If you use a CPAP at night, you may bring  your mask/headgear for your overnight stay.   You will be asked to remove any contacts, glasses, piercing's, hearing aid's, dentures/partials prior to surgery. Please bring cases for these items if needed.    Patients discharged the day of surgery will not be allowed to drive home, and someone needs to stay with them for 24 hours.  SURGICAL WAITING ROOM VISITATION Patients may have no more than 2 support people in the waiting area - these visitors may rotate.   Pre-op nurse will coordinate an appropriate  time for 1 ADULT support person, who may not rotate, to accompany patient in pre-op.  Children under the age of 19 must have an adult with them who is not the patient and must remain in the main waiting area with an adult.  If the patient needs to stay at the hospital during part of their recovery, the visitor guidelines for inpatient rooms apply.  Please refer to the [facility] website for the visitor guidelines for any additional information.   If you received a COVID test during your pre-op visit  it is requested that you wear a mask when out in public, stay away from anyone that may not be feeling well and notify your surgeon if you develop symptoms. If you have been in contact with anyone that has tested positive in the last 10 days please notify you surgeon.      Pre-operative 5 CHG Bathing Instructions   You can play a key role in reducing the risk of infection after surgery. Your skin needs to be as free of germs as possible. You can reduce the number of germs on your skin by washing with CHG (chlorhexidine  gluconate) soap before surgery. CHG is an antiseptic soap that kills germs and continues to kill germs even after washing.   DO NOT use if you have an allergy to chlorhexidine /CHG or antibacterial soaps. If your skin becomes reddened or irritated, stop using the CHG and notify one of our RNs at [phone].   Please shower with the CHG soap starting 4 days before surgery using the following schedule:     Please keep in mind the following:  DO NOT shave, including legs and underarms, starting the day of your first shower.   You may shave your face at any point before/day of surgery.  Place clean sheets on your bed the day you start using CHG soap. Use a clean washcloth (not used since being washed) for each shower. DO NOT sleep with pets once you start using the CHG.   CHG Shower Instructions:  Wash your face and private area with normal soap. If you choose to wash your  hair, wash first with your normal shampoo.  After you use shampoo/soap, rinse your hair and body thoroughly to remove shampoo/soap residue.  Turn the water OFF and apply about 3 tablespoons (45 ml) of CHG soap to a CLEAN washcloth.  Apply CHG soap ONLY FROM YOUR NECK DOWN TO YOUR TOES (washing for 3-5 minutes)  DO NOT use CHG soap on face, private areas, open wounds, or sores.  Pay special attention to the area where your surgery is being performed.  If you are having back surgery, having someone wash your back for you may be helpful. Wait 2 minutes after CHG soap is applied, then you may rinse off the CHG soap.  Pat dry with a clean towel  Put on clean clothes/pajamas   If you choose to wear lotion, please use ONLY the CHG-compatible lotions that  are listed below.  Additional instructions for the day of surgery: DO NOT APPLY any lotions, deodorants, cologne, or perfumes.   Do not bring valuables to the hospital. [facility] is not responsible for any belongings/valuables. Do not wear nail polish, gel polish, artificial nails, or any other type of covering on natural nails (fingers and toes) Do not wear jewelry or makeup Put on clean/comfortable clothes.  Please brush your teeth.  Ask your nurse before applying any prescription medications to the skin.     CHG Compatible Lotions   Aveeno Moisturizing lotion  Cetaphil Moisturizing Cream  Cetaphil Moisturizing Lotion  Clairol Herbal Essence Moisturizing Lotion, Dry Skin  Clairol Herbal Essence Moisturizing Lotion, Extra Dry Skin  Clairol Herbal Essence Moisturizing Lotion, Normal Skin  Curel Age Defying Therapeutic Moisturizing Lotion with Alpha Hydroxy  Curel Extreme Care Body Lotion  Curel Soothing Hands Moisturizing Hand Lotion  Curel Therapeutic Moisturizing Cream, Fragrance-Free  Curel Therapeutic Moisturizing Lotion, Fragrance-Free  Curel Therapeutic Moisturizing Lotion, Original Formula  Eucerin Daily Replenishing Lotion   Eucerin Dry Skin Therapy Plus Alpha Hydroxy Crme  Eucerin Dry Skin Therapy Plus Alpha Hydroxy Lotion  Eucerin Original Crme  Eucerin Original Lotion  Eucerin Plus Crme Eucerin Plus Lotion  Eucerin TriLipid Replenishing Lotion  Keri Anti-Bacterial Hand Lotion  Keri Deep Conditioning Original Lotion Dry Skin Formula Softly Scented  Keri Deep Conditioning Original Lotion, Fragrance Free Sensitive Skin Formula  Keri Lotion Fast Absorbing Fragrance Free Sensitive Skin Formula  Keri Lotion Fast Absorbing Softly Scented Dry Skin Formula  Keri Original Lotion  Keri Skin Renewal Lotion Keri Silky Smooth Lotion  Keri Silky Smooth Sensitive Skin Lotion  Nivea Body Creamy Conditioning Oil  Nivea Body Extra Enriched Lotion  Nivea Body Original Lotion  Nivea Body Sheer Moisturizing Lotion Nivea Crme  Nivea Skin Firming Lotion  NutraDerm 30 Skin Lotion  NutraDerm Skin Lotion  NutraDerm Therapeutic Skin Cream  NutraDerm Therapeutic Skin Lotion  ProShield Protective Hand Cream  Provon moisturizing lotion  Please read over the following fact sheets that you were given.

## 2024-04-01 ENCOUNTER — Other Ambulatory Visit: Payer: Self-pay

## 2024-04-01 ENCOUNTER — Encounter (HOSPITAL_COMMUNITY): Payer: Self-pay

## 2024-04-01 ENCOUNTER — Encounter (HOSPITAL_COMMUNITY)
Admission: RE | Admit: 2024-04-01 | Discharge: 2024-04-01 | Disposition: A | Discharge status: Home / Self Care | Source: Ambulatory Visit | Attending: Neurological Surgery | Admitting: Neurological Surgery

## 2024-04-01 VITALS — BP 132/55 | HR 69 | Temp 97.8°F | Resp 17 | Ht 64.0 in | Wt 237.3 lb

## 2024-04-01 DIAGNOSIS — Z7984 Long term (current) use of oral hypoglycemic drugs: Secondary | ICD-10-CM | POA: Insufficient documentation

## 2024-04-01 DIAGNOSIS — R0609 Other forms of dyspnea: Secondary | ICD-10-CM | POA: Insufficient documentation

## 2024-04-01 DIAGNOSIS — I10 Essential (primary) hypertension: Secondary | ICD-10-CM

## 2024-04-01 DIAGNOSIS — M19011 Primary osteoarthritis, right shoulder: Secondary | ICD-10-CM | POA: Insufficient documentation

## 2024-04-01 DIAGNOSIS — Z794 Long term (current) use of insulin: Secondary | ICD-10-CM | POA: Diagnosis not present

## 2024-04-01 DIAGNOSIS — M47817 Spondylosis without myelopathy or radiculopathy, lumbosacral region: Secondary | ICD-10-CM | POA: Diagnosis not present

## 2024-04-01 DIAGNOSIS — M51369 Other intervertebral disc degeneration, lumbar region without mention of lumbar back pain or lower extremity pain: Secondary | ICD-10-CM | POA: Diagnosis not present

## 2024-04-01 DIAGNOSIS — G4733 Obstructive sleep apnea (adult) (pediatric): Secondary | ICD-10-CM | POA: Insufficient documentation

## 2024-04-01 DIAGNOSIS — M797 Fibromyalgia: Secondary | ICD-10-CM | POA: Diagnosis not present

## 2024-04-01 DIAGNOSIS — E119 Type 2 diabetes mellitus without complications: Secondary | ICD-10-CM | POA: Insufficient documentation

## 2024-04-01 DIAGNOSIS — Z01812 Encounter for preprocedural laboratory examination: Secondary | ICD-10-CM | POA: Diagnosis present

## 2024-04-01 DIAGNOSIS — Z01818 Encounter for other preprocedural examination: Secondary | ICD-10-CM

## 2024-04-01 HISTORY — DX: Unspecified atherosclerosis: I70.90

## 2024-04-01 HISTORY — DX: Essential (primary) hypertension: I10

## 2024-04-01 HISTORY — DX: Other forms of dyspnea: R06.09

## 2024-04-01 LAB — SURGICAL PCR SCREEN
MRSA, PCR: NEGATIVE
Staphylococcus aureus: NEGATIVE

## 2024-04-01 LAB — TYPE AND SCREEN
ABO/RH(D): O POS
Antibody Screen: NEGATIVE

## 2024-04-01 LAB — CBC
HCT: 35 % — ABNORMAL LOW (ref 36.0–46.0)
Hemoglobin: 11 g/dL — ABNORMAL LOW (ref 12.0–15.0)
MCH: 29.2 pg (ref 26.0–34.0)
MCHC: 31.4 g/dL (ref 30.0–36.0)
MCV: 92.8 fL (ref 80.0–100.0)
Platelets: 215 10*3/uL (ref 150–400)
RBC: 3.77 MIL/uL — ABNORMAL LOW (ref 3.87–5.11)
RDW: 15.2 % (ref 11.5–15.5)
WBC: 8.3 10*3/uL (ref 4.0–10.5)
nRBC: 0 % (ref 0.0–0.2)

## 2024-04-01 LAB — PROTIME-INR
INR: 1 (ref 0.8–1.2)
Prothrombin Time: 13.6 s (ref 11.4–15.2)

## 2024-04-01 LAB — GLUCOSE, CAPILLARY: Glucose-Capillary: 106 mg/dL — ABNORMAL HIGH (ref 70–99)

## 2024-04-01 LAB — HEMOGLOBIN A1C
Hgb A1c MFr Bld: 6.4 % — ABNORMAL HIGH (ref 4.8–5.6)
Mean Plasma Glucose: 136.98 mg/dL

## 2024-04-01 LAB — BASIC METABOLIC PANEL WITH GFR
Anion gap: 9 (ref 5–15)
BUN: 18 mg/dL (ref 8–23)
CO2: 28 mmol/L (ref 22–32)
Calcium: 9.6 mg/dL (ref 8.9–10.3)
Chloride: 104 mmol/L (ref 98–111)
Creatinine, Ser: 1.42 mg/dL — ABNORMAL HIGH (ref 0.44–1.00)
GFR, Estimated: 41 mL/min — ABNORMAL LOW (ref >60)
Glucose, Bld: 83 mg/dL (ref 70–99)
Potassium: 5.3 mmol/L — ABNORMAL HIGH (ref 3.5–5.1)
Sodium: 141 mmol/L (ref 135–145)

## 2024-04-01 NOTE — Progress Notes (Signed)
 PCP - Dr. Dea Evert  Cardiologist - Dr. Loye Rumble- pt states she was only seeing him for the Repatha shot for her high cholesterol. She said she no longer takes that shot and only sees him on an "as needed" basis  PPM/ICD - denies   Chest x-ray - 03/23/24- CE (not reviewed yet) EKG - 04/22/23- CE- tracing requested Stress Test - 2022 ECHO - 2022 Cardiac Cath - denies  Sleep Study - OSA+ CPAP - denies  Fasting Blood Sugar - 100-110 Checks Blood Sugar 1-2 times a day  Last dose of GLP1 agonist-  n/a   ASA/Blood Thinner Instructions: n/a   ERAS Protcol - no, NPO   COVID TEST- n/a   Anesthesia review: yes, cardiac hx, records requested  Patient denies shortness of breath, fever, cough and chest pain at PAT appointment   All instructions explained to the patient, with a verbal understanding of the material. Patient agrees to go over the instructions while at home for a better understanding. The opportunity to ask questions was provided.

## 2024-04-02 NOTE — Anesthesia Preprocedure Evaluation (Addendum)
 Anesthesia Evaluation    History of Anesthesia Complications (+) PONV and history of anesthetic complications  Airway        Dental   Pulmonary sleep apnea           Cardiovascular hypertension,      Neuro/Psych  Headaches  Neuromuscular disease    GI/Hepatic Neg liver ROS,,,  Endo/Other  diabetes    Renal/GU Renal InsufficiencyRenal diseaseLab Results      Component                Value               Date                      NA                       141                 04/01/2024                K                        5.3 (H)             04/01/2024                CO2                      28                  04/01/2024                GLUCOSE                  83                  04/01/2024                BUN                      18                  04/01/2024                CREATININE               1.42 (H)            04/01/2024                CALCIUM                  9.6                 04/01/2024                GFRNONAA                 41 (L)              04/01/2024                Musculoskeletal  (+) Arthritis ,  Fibromyalgia -  Abdominal   Peds  Hematology  (+) Blood dyscrasia, anemia Lab Results      Component                Value  Date                      WBC                      8.3                 04/01/2024                HGB                      11.0 (L)            04/01/2024                HCT                      35.0 (L)            04/01/2024                MCV                      92.8                04/01/2024                PLT                      215                 04/01/2024              Anesthesia Other Findings   Reproductive/Obstetrics                              Anesthesia Physical Anesthesia Plan  ASA: 3  Anesthesia Plan: General   Post-op Pain Management: Tylenol  PO (pre-op)*   Induction:   PONV Risk Score and Plan: 4 or greater and  Ondansetron , Dexamethasone  and Midazolam   Airway Management Planned: Oral ETT  Additional Equipment: None  Intra-op Plan:   Post-operative Plan: Extubation in OR  Informed Consent:   Plan Discussed with: CRNA  Anesthesia Plan Comments: (PAT note written 04/02/2024 by Allison Zelenak, PA-C.  )        Anesthesia Quick Evaluation

## 2024-04-02 NOTE — Progress Notes (Addendum)
 Anesthesia Chart Review:  Case: [id number] Date/Time: 04/12/24 0715   Procedure: PLIF - L5-S1 - Posterior Lateral and Interbody fusion, extension of hardware (Back)   Anesthesia type: General   Pre-op diagnosis: Spondylolisthesis   [facility]: [facility] ROOM 18 / [facility]   Surgeons: Joaquin Mulberry, MD       DISCUSSION: Patient is a 68 year old female scheduled for the above procedure.  History includes never smoker, postoperative N/V, DM2, OSA (does not use CPAP), GERD, fibromyalgia, iron deficiency, cholecystectomy, hysterectomy, appendectomy, spinal surgery (Right L3-4 extraforaminal microdiscectomy 12/01/15; L3-4 PLIF 03/13/16; L4-5 PLIF 06/12/17). BMI is consistent with  morbid obesity.   She had bronchoscopy on 12/24/2022 by pulmonologist Jorja Newport, DO at [facility] for possible tracheal mass. She underwent right interscalene block for right rotator cuff repair on 12/04/22. She presented to ED on 12/07/22 with acute SOB with home O2 sats in the 80s. She had been taking Percocet for pain intermittently with some associated drowsiness. She required BiPAP initially but then transitioned to 2L O2/[place]. CTA chest was negative for PE, but showed findings consistent with bronchitis and upper airway trapping, increased elevation of the right hemidiaphgram, and 1.4 x 0.8 x 1.9 cm, solid lesion or adherent material along the left posterolateral tracheal wall which narrowed the tracheal up to 50%. She was weaned from supplemental O2 by POD#1. 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 She had out-patient bronchoscopy on 12/25/22 with normal findings, ETT retracted to the level of the vocal cords and there was no evidence of tumor or mucosal abnormality within the visualized trachea.  No specimens collected. Repeat chest CT on 01/05/23 showed  improving patchy  groundglass opacities, resolution of retained tracheal secretions, no mass, with mild residual narrowing of the trachea by anterior displacement of the esophagus due to deviously noted aberrant right subclavian artery.  She has had cardiology evaluation by Dr. Gerianne Kobs for dyslipidemia and exertional dyspnea. 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  She was intolerant to statins, requiring hospitalization for myositis/rhabdomyolysis.  She was started on Zetia as well as Repatha with LDL goal of less than 100. She was unable to afford Rapatha.  She is not longer on Zetia either. Dyspnea workup included echocardiogram which showed preserved LV function with septal basal hypertrophy and negative nuclear stress test in 2022.  At least visit on 08/02/23, she denied chest pain, orthopnea, PND, and syncope. Breathing stable. No specific follow-up recommendations noted---patient reported as needed basis as she was unable to continue with Repatha injections.   A1c 6.4%. She is on Lantus 30 units Q HS, metformin  1000 mg BID, Actos 30 mg daily.   03/23/24 CXR is still in process. (UPDATE: 04/06/24 9:15 AM: CXR showed no acute process. Anesthesia team to evaluate on the day of surgery.)  VS: BP (!) 132/55   Pulse 69   Temp 36.6 C   Resp 17   Ht 5\' 4"  (1.626 m)   Wt 107.6 kg   SpO2 100%   BMI 40.73 kg/m   PROVIDERS: Render Carrie, DO is PCP. She had Medicare Annual Wellness visit o 02/18/24. She is aware of surgery plans.  Junagadhwalla, Zahid, MD is cardiologist   LABS: Preoperative labs noted. Cr 1.42, previously 1.42 on 02/11/24 and 1.44 on 08/06/23 ([facility]).  (all labs ordered are listed, but only abnormal results are displayed)  Labs Reviewed  GLUCOSE, CAPILLARY - Abnormal; Notable for  the following components:      Result Value   Glucose-Capillary 106 (*)    All other components within normal limits  HEMOGLOBIN A1C - Abnormal; Notable for the following components:    Hgb A1c MFr Bld 6.4 (*)    All other components within normal limits  BASIC METABOLIC PANEL WITH GFR - Abnormal; Notable for the following components:   Potassium 5.3 (*)    Creatinine, Ser 1.42 (*)    GFR, Estimated 41 (*)    All other components within normal limits  CBC - Abnormal; Notable for the following components:   RBC 3.77 (*)    Hemoglobin 11.0 (*)    HCT 35.0 (*)    All other components within normal limits  SURGICAL PCR SCREEN  PROTIME-INR  TYPE AND SCREEN     IMAGES: CXR 03/23/24 ([facility]/PACS):  FINDINGS: - Cardiomediastinal silhouette unchanged in size and contour. No evidence of central vascular congestion. No interlobular septal thickening. - Improved aeration compared to the prior. - No pneumothorax or pleural effusion. Coarsened interstitial markings, with no confluent airspace disease. - No acute displaced fracture. Degenerative changes of the spine. IMPRESSION: Negative for acute cardiopulmonary disease   MRI L-spine 11/25/23: IMPRESSION: 1. Previous fusion surgery from L3 through L5. No apparent compressive stenosis of the canal or foramina. 2. L5-S1: Bilateral facet osteoarthritis with 2 mm of anterolisthesis. Mild bulging of the disc more towards the right. Facet hypertrophy is bilateral but more pronounced on the right. Stenosis of the right lateral recess could possibly affect the S1 nerve. Mild worsening since 2022. Small synovial cyst previously associated with the left facet joint is no longer visible. 3. L2-3: Moderate bulging of the disc. Mild facet and ligamentous hypertrophy. No compressive stenosis. Minimal worsening since 2022.   MRI Right Shoulder 07/17/23 ([facility]): IMPRESSION: 1. Prior rotator cuff repair. Complete tear of the supraspinatus tendon with 4.4 cm of retraction. 2. Large full-thickness, near complete, tear of the infraspinatus tendon with a few intact posterior fibers. 3. Mild tendinosis of the subscapularis  tendon. 4. Moderate-severe osteoarthritis of the right glenohumeral joint.   CT Chest 01/05/23 ([facility]): IMPRESSION:  1. Resolution of retained secretions in the trachea with no mass  seen. There is mild residual narrowing of the trachea by anterior  displacement of the esophagus by the previously noted aberrant right  subclavian artery.  2. Mild residual patchy ground-glass opacity throughout both lungs  with improvement. This is compatible with improving  pneumonitis/pulmonary edema.    EKG: 04/22/23 ([facility]): Tracing scanned under Media tab, AMB Correspondence. Sinus rhythm  Low voltage QRS, consider pulmonary disease or obesity  Otherwise normal  When compared with ECG of 07-Dec-2022 02 10,  Evidence for Inferior infarct are no longer present  Confirmed by Marylouise Socks  [phone]  on 04-22-2023 2 39 28 PM   CV: Nuclear stress test 09/10/21 ([facility]): IMPRESSION  1.  No evidence of fixed or inducible ischemia on the study.  There was  noted on the stress images some patient motion and there was also on the  rest images gut uptake adjacent to the inferior septal wall.  Fortunately  these did not influence imaging.  2.  Calculated ejection fraction is hyperdynamic at 85%.  3.  No evidence of convincing reversible or fixed ischemia on the study.   The ejection fraction is hyperkinetic.   Echo 07/30/21 ([facility]): SUMMARY   The left ventricular size is normal. There is normal left ventricular  wall  thickness. Basal left ventricular septal hypertrophy The left  ventricular wall motion is normal. Left ventricular systolic function  is normal.LV ejection fraction = 60-65%.  Left ventricular filling pattern is indeterminate. Left atrial  pressure is indeterminate due to 'E/e' > 8 and < 14'  The right ventricle is normal in size and function.  The left atrial size is normal.  There is no significant valvular stenosis or regurgitation.  The IVC is normal in size with an  inspiratory collapse of greater then  50%, suggesting normal right atrial pressure.  There was insufficient TR detected to calculate RV systolic pressure.    Past Medical History:  Diagnosis Date   Anxiety    Arthritis    OA,- hands (RA)& back     Atherosclerosis    Constipation    Diabetes mellitus without complication (HCC)    type 2   DOE (dyspnea on exertion)    Fibromyalgia    takes cymbalta  and gabapentin    GERD (gastroesophageal reflux disease)    takes nexium   Headache    headaches and migraines   Hypertension    well-controlled   Low iron    hx of - no meds now   PONV (postoperative nausea and vomiting)    Sleep apnea    does not use CPAP    Past Surgical History:  Procedure Laterality Date   ABDOMINAL HYSTERECTOMY     APPENDECTOMY     BACK SURGERY     lumbar decompression    BILATERAL CARPAL TUNNEL RELEASE     BLADDER SUSPENSION     CHOLECYSTECTOMY     COLONOSCOPY     LUMBAR LAMINECTOMY/DECOMPRESSION MICRODISCECTOMY Right 12/01/2015   Procedure:  extraforaminal Microdiscectomy  - Right Lumbar three-lumbar four ;  Surgeon: Isadora Mar, MD;  Location: [facility];  Service: Neurosurgery;  Laterality: Right;   TRANSFORAMINAL LUMBAR INTERBODY FUSION (TLIF) WITH PEDICLE SCREW FIXATION 1 LEVEL Right 03/13/2016   Procedure: Right LUMBAR THREE- FOUR TRANSFORAMINAL LUMBAR INTERBODY FUSION (TLIF) WITH PEDICLE SCREW FIXATION ;  Surgeon: Isadora Mar, MD;  Location: [facility];  Service: Neurosurgery;  Laterality: Right;   TRIGGER FINGER RELEASE Right    index finger    MEDICATIONS:  busPIRone  (BUSPAR ) 10 MG tablet   celecoxib  (CELEBREX ) 200 MG capsule   cyclobenzaprine  (FLEXERIL ) 10 MG tablet   esomeprazole (NEXIUM) 40 MG capsule   furosemide  (LASIX ) 20 MG tablet   gabapentin  (NEURONTIN ) 600 MG tablet   HYDROcodone -acetaminophen  (NORCO) 10-325 MG tablet   insulin  glargine (LANTUS) 100 UNIT/ML injection   metFORMIN  (GLUCOPHAGE -XR) 500 MG 24 hr tablet    methocarbamol (ROBAXIN) 500 MG tablet   metoprolol tartrate (LOPRESSOR) 25 MG tablet   montelukast (SINGULAIR) 10 MG tablet   PARoxetine (PAXIL) 30 MG tablet   pioglitazone (ACTOS) 30 MG tablet   rOPINIRole (REQUIP) 0.5 MG tablet   traMADol (ULTRAM) 50 MG tablet   traZODone (DESYREL) 100 MG tablet   No current facility-administered medications for this encounter.    Ella Gun, PA-C Surgical Short Stay/Anesthesiology [facility] Phone [phone] [facility] Phone [phone] 04/02/2024 5:58 PM

## 2024-04-12 ENCOUNTER — Ambulatory Visit (HOSPITAL_COMMUNITY)
Admission: RE | Admit: 2024-04-12 | Discharge: 2024-04-12 | Disposition: A | Discharge status: Home / Self Care | Payer: Medicare HMO | Attending: Neurological Surgery | Admitting: Neurological Surgery

## 2024-04-12 ENCOUNTER — Encounter (HOSPITAL_COMMUNITY): Payer: Self-pay | Admitting: Neurological Surgery

## 2024-04-12 ENCOUNTER — Encounter (HOSPITAL_COMMUNITY)
Admission: RE | Disposition: A | Discharge status: Home / Self Care | Payer: Self-pay | Source: Home / Self Care | Attending: Neurological Surgery

## 2024-04-12 ENCOUNTER — Other Ambulatory Visit: Payer: Self-pay

## 2024-04-12 ENCOUNTER — Ambulatory Visit (HOSPITAL_COMMUNITY): Payer: Self-pay | Admitting: Vascular Surgery

## 2024-04-12 ENCOUNTER — Ambulatory Visit (HOSPITAL_COMMUNITY)

## 2024-04-12 DIAGNOSIS — E119 Type 2 diabetes mellitus without complications: Secondary | ICD-10-CM

## 2024-04-12 DIAGNOSIS — M4317 Spondylolisthesis, lumbosacral region: Secondary | ICD-10-CM | POA: Insufficient documentation

## 2024-04-12 DIAGNOSIS — Z538 Procedure and treatment not carried out for other reasons: Secondary | ICD-10-CM | POA: Diagnosis not present

## 2024-04-12 LAB — GLUCOSE, CAPILLARY: Glucose-Capillary: 113 mg/dL — ABNORMAL HIGH (ref 70–99)

## 2024-04-12 SURGERY — POSTERIOR LUMBAR FUSION 1 LEVEL
Anesthesia: General | Site: Back

## 2024-04-12 MED ORDER — ACETAMINOPHEN 500 MG PO TABS
1000.0000 mg | ORAL_TABLET | ORAL | Status: AC
  Administered 2024-04-12: 1000 mg via ORAL
  Filled 2024-04-12: qty 2

## 2024-04-12 MED ORDER — CHLORHEXIDINE GLUCONATE 0.12 % MT SOLN
15.0000 mL | Freq: Once | OROMUCOSAL | Status: AC
  Administered 2024-04-12: 15 mL via OROMUCOSAL
  Filled 2024-04-12: qty 15

## 2024-04-12 MED ORDER — CEFAZOLIN SODIUM-DEXTROSE 2-4 GM/100ML-% IV SOLN
2.0000 g | INTRAVENOUS | Status: DC
Start: 2024-04-12 — End: 2024-04-12
  Filled 2024-04-12: qty 100

## 2024-04-12 MED ORDER — GABAPENTIN 300 MG PO CAPS
300.0000 mg | ORAL_CAPSULE | ORAL | Status: DC
  Filled 2024-04-12: qty 1

## 2024-04-12 MED ORDER — FENTANYL CITRATE (PF) 250 MCG/5ML IJ SOLN
INTRAMUSCULAR | Status: AC
Start: 2024-04-12 — End: ?
  Filled 2024-04-12: qty 5

## 2024-04-12 MED ORDER — EPHEDRINE 5 MG/ML INJ
INTRAVENOUS | Status: AC
  Filled 2024-04-12: qty 5

## 2024-04-12 MED ORDER — CHLORHEXIDINE GLUCONATE CLOTH 2 % EX PADS: 6.0000 | MEDICATED_PAD | Freq: Once | CUTANEOUS | Status: DC

## 2024-04-12 MED ORDER — MIDAZOLAM HCL 2 MG/2ML IJ SOLN
INTRAMUSCULAR | Status: AC
  Filled 2024-04-12: qty 2

## 2024-04-12 MED ORDER — ONDANSETRON HCL 4 MG/2ML IJ SOLN
INTRAMUSCULAR | Status: AC
  Filled 2024-04-12: qty 2

## 2024-04-12 MED ORDER — ORAL CARE MOUTH RINSE: 15.0000 mL | Freq: Once | OROMUCOSAL | Status: AC

## 2024-04-12 MED ORDER — LACTATED RINGERS IV SOLN: INTRAVENOUS | Status: DC

## 2024-04-12 MED ORDER — PROPOFOL 10 MG/ML IV BOLUS
INTRAVENOUS | Status: AC
  Filled 2024-04-12: qty 20

## 2024-04-12 MED ORDER — LIDOCAINE 2% (20 MG/ML) 5 ML SYRINGE
INTRAMUSCULAR | Status: AC
Start: 2024-04-12 — End: ?
  Filled 2024-04-12: qty 5

## 2024-04-12 MED ORDER — DEXAMETHASONE SODIUM PHOSPHATE 10 MG/ML IJ SOLN
INTRAMUSCULAR | Status: AC
  Filled 2024-04-12: qty 1

## 2024-04-12 MED ORDER — ROCURONIUM BROMIDE 10 MG/ML (PF) SYRINGE
PREFILLED_SYRINGE | INTRAVENOUS | Status: AC
  Filled 2024-04-12: qty 10

## 2024-04-12 MED ORDER — PHENYLEPHRINE 80 MCG/ML (10ML) SYRINGE FOR IV PUSH (FOR BLOOD PRESSURE SUPPORT)
PREFILLED_SYRINGE | INTRAVENOUS | Status: AC
  Filled 2024-04-12: qty 10

## 2024-04-12 NOTE — H&P (Signed)
 Surgery canceled for respiratory tract infection for which she just out of oral antibiotics 2 days ago.  Will reschedule once infection clears

## 2024-04-12 NOTE — Progress Notes (Signed)
 Surgery cancelled by Dr. Rochelle Chu and anesthesia d/t pt recovering from recent URI currently on ABX. Pt verbalizes understanding and all concerns have been addressed by Dr. Rochelle Chu. PIV removed. Pt d/c'd to home with spouse.   Pia Brew, RN

## 2024-04-27 ENCOUNTER — Other Ambulatory Visit: Payer: Self-pay | Admitting: Neurological Surgery

## 2024-04-27 ENCOUNTER — Encounter (HOSPITAL_COMMUNITY): Payer: Self-pay | Admitting: Neurological Surgery

## 2024-04-27 NOTE — Progress Notes (Signed)
 Notified patient that surgery is scheduled for May 21 at 1145 and that she his to check in at the admitting office at [facility] at 9:45am. Patient is familiar with the location as she was here for surgery on 04/12/2024, but it was canceled. Reviewed DOS medications with patient. She will take 15 units of Lantus insulin  tonight. Patient states she will refer to the written pre op instructions given to at her PAT appointment for all other information regarding preparing for surgery. Gave her the phone number [phone] to call if she is late tomorrow. Also told her to call my office [phone] until 6pm tonight if she has questions. Pt stated that she finished antibiotics 5/13. She states no symptoms of upper respiratory infection since 5/10.

## 2024-04-28 ENCOUNTER — Ambulatory Visit (HOSPITAL_BASED_OUTPATIENT_CLINIC_OR_DEPARTMENT_OTHER)

## 2024-04-28 ENCOUNTER — Ambulatory Visit (HOSPITAL_COMMUNITY)

## 2024-04-28 ENCOUNTER — Other Ambulatory Visit: Payer: Self-pay

## 2024-04-28 ENCOUNTER — Observation Stay (HOSPITAL_COMMUNITY)
Admission: RE | Admit: 2024-04-28 | Discharge: 2024-04-29 | Disposition: A | Discharge status: Home / Self Care | Attending: Neurological Surgery | Admitting: Neurological Surgery

## 2024-04-28 ENCOUNTER — Encounter (HOSPITAL_COMMUNITY): Payer: Self-pay | Admitting: Neurological Surgery

## 2024-04-28 ENCOUNTER — Ambulatory Visit (HOSPITAL_COMMUNITY)
Admission: RE | Disposition: A | Discharge status: Home / Self Care | Payer: Self-pay | Source: Home / Self Care | Attending: Neurological Surgery

## 2024-04-28 DIAGNOSIS — M4316 Spondylolisthesis, lumbar region: Secondary | ICD-10-CM

## 2024-04-28 DIAGNOSIS — Z981 Arthrodesis status: Principal | ICD-10-CM

## 2024-04-28 DIAGNOSIS — M4317 Spondylolisthesis, lumbosacral region: Principal | ICD-10-CM | POA: Insufficient documentation

## 2024-04-28 DIAGNOSIS — Z7984 Long term (current) use of oral hypoglycemic drugs: Secondary | ICD-10-CM | POA: Insufficient documentation

## 2024-04-28 DIAGNOSIS — I10 Essential (primary) hypertension: Secondary | ICD-10-CM

## 2024-04-28 DIAGNOSIS — Z79899 Other long term (current) drug therapy: Secondary | ICD-10-CM | POA: Insufficient documentation

## 2024-04-28 DIAGNOSIS — E119 Type 2 diabetes mellitus without complications: Secondary | ICD-10-CM | POA: Insufficient documentation

## 2024-04-28 DIAGNOSIS — R2689 Other abnormalities of gait and mobility: Secondary | ICD-10-CM | POA: Insufficient documentation

## 2024-04-28 DIAGNOSIS — M5117 Intervertebral disc disorders with radiculopathy, lumbosacral region: Secondary | ICD-10-CM | POA: Insufficient documentation

## 2024-04-28 DIAGNOSIS — Z794 Long term (current) use of insulin: Secondary | ICD-10-CM | POA: Diagnosis not present

## 2024-04-28 HISTORY — PX: POSTERIOR LUMBAR FUSION: SHX6036

## 2024-04-28 LAB — GLUCOSE, CAPILLARY
Glucose-Capillary: 115 mg/dL — ABNORMAL HIGH (ref 70–99)
Glucose-Capillary: 97 mg/dL (ref 70–99)
Glucose-Capillary: 135 mg/dL — ABNORMAL HIGH (ref 70–99)
Glucose-Capillary: 164 mg/dL — ABNORMAL HIGH (ref 70–99)
Glucose-Capillary: 457 mg/dL — ABNORMAL HIGH (ref 70–99)

## 2024-04-28 LAB — TYPE AND SCREEN
ABO/RH(D): O POS
Antibody Screen: NEGATIVE

## 2024-04-28 SURGERY — POSTERIOR LUMBAR FUSION 1 LEVEL
Anesthesia: General | Site: Back

## 2024-04-28 MED ORDER — MONTELUKAST SODIUM 10 MG PO TABS
10.0000 mg | ORAL_TABLET | Freq: Every day | ORAL | Status: DC
  Administered 2024-04-29: 10 mg via ORAL
  Filled 2024-04-28: qty 1

## 2024-04-28 MED ORDER — MIDAZOLAM HCL 2 MG/2ML IJ SOLN
INTRAMUSCULAR | Status: AC
  Filled 2024-04-28: qty 2

## 2024-04-28 MED ORDER — LACTATED RINGERS IV SOLN: INTRAVENOUS | Status: DC

## 2024-04-28 MED ORDER — OXYCODONE HCL 5 MG PO TABS: 10.0000 mg | ORAL_TABLET | ORAL | Status: DC | PRN

## 2024-04-28 MED ORDER — VANCOMYCIN HCL 1500 MG/300ML IV SOLN
INTRAVENOUS | Status: AC
  Filled 2024-04-28: qty 300

## 2024-04-28 MED ORDER — PHENYLEPHRINE 80 MCG/ML (10ML) SYRINGE FOR IV PUSH (FOR BLOOD PRESSURE SUPPORT)
PREFILLED_SYRINGE | INTRAVENOUS | Status: DC | PRN
Start: 2024-04-28 — End: 2024-04-28
  Administered 2024-04-28: 240 ug via INTRAVENOUS

## 2024-04-28 MED ORDER — SENNA 8.6 MG PO TABS
1.0000 | ORAL_TABLET | Freq: Two times a day (BID) | ORAL | Status: DC
Start: 2024-04-28 — End: 2024-04-29
  Administered 2024-04-28 – 2024-04-29 (×2): 8.6 mg via ORAL
  Filled 2024-04-28 (×2): qty 1

## 2024-04-28 MED ORDER — FENTANYL CITRATE (PF) 250 MCG/5ML IJ SOLN
INTRAMUSCULAR | Status: AC
  Filled 2024-04-28: qty 5

## 2024-04-28 MED ORDER — HYDROCODONE-ACETAMINOPHEN 10-325 MG PO TABS
1.0000 | ORAL_TABLET | ORAL | Status: DC | PRN
  Administered 2024-04-28 – 2024-04-29 (×3): 1 via ORAL
  Filled 2024-04-28 (×4): qty 1

## 2024-04-28 MED ORDER — PROPOFOL 10 MG/ML IV BOLUS
INTRAVENOUS | Status: DC | PRN
  Administered 2024-04-28: 80 mg via INTRAVENOUS

## 2024-04-28 MED ORDER — LIDOCAINE 2% (20 MG/ML) 5 ML SYRINGE
INTRAMUSCULAR | Status: AC
  Filled 2024-04-28: qty 5

## 2024-04-28 MED ORDER — CEFAZOLIN SODIUM-DEXTROSE 2-4 GM/100ML-% IV SOLN
2.0000 g | Freq: Three times a day (TID) | INTRAVENOUS | Status: AC
  Administered 2024-04-28 – 2024-04-29 (×2): 2 g via INTRAVENOUS
  Filled 2024-04-28 (×2): qty 100

## 2024-04-28 MED ORDER — LIDOCAINE 2% (20 MG/ML) 5 ML SYRINGE: INTRAMUSCULAR | Status: DC | PRN

## 2024-04-28 MED ORDER — BUPIVACAINE HCL (PF) 0.25 % IJ SOLN
INTRAMUSCULAR | Status: AC
  Filled 2024-04-28: qty 30

## 2024-04-28 MED ORDER — INSULIN GLARGINE-YFGN 100 UNIT/ML ~~LOC~~ SOLN
30.0000 [IU] | Freq: Every day | SUBCUTANEOUS | Status: DC
  Administered 2024-04-28: 30 [IU] via SUBCUTANEOUS
  Filled 2024-04-28 (×2): qty 0.3

## 2024-04-28 MED ORDER — DEXAMETHASONE SODIUM PHOSPHATE 10 MG/ML IJ SOLN
INTRAMUSCULAR | Status: AC
  Filled 2024-04-28: qty 1

## 2024-04-28 MED ORDER — FUROSEMIDE 20 MG PO TABS
20.0000 mg | ORAL_TABLET | Freq: Two times a day (BID) | ORAL | Status: DC
  Administered 2024-04-29: 20 mg via ORAL
  Filled 2024-04-28: qty 1

## 2024-04-28 MED ORDER — METHOCARBAMOL 1000 MG/10ML IJ SOLN: 500.0000 mg | Freq: Four times a day (QID) | INTRAMUSCULAR | Status: DC | PRN

## 2024-04-28 MED ORDER — MENTHOL 3 MG MT LOZG: 1.0000 | LOZENGE | OROMUCOSAL | Status: DC | PRN

## 2024-04-28 MED ORDER — ONDANSETRON HCL 4 MG/2ML IJ SOLN
INTRAMUSCULAR | Status: DC | PRN
  Administered 2024-04-28: 4 mg via INTRAVENOUS

## 2024-04-28 MED ORDER — INSULIN ASPART 100 UNIT/ML IJ SOLN
0.0000 [IU] | Freq: Three times a day (TID) | INTRAMUSCULAR | Status: DC
Start: 2024-04-29 — End: 2024-04-29
  Administered 2024-04-29: 5 [IU] via SUBCUTANEOUS

## 2024-04-28 MED ORDER — PROPOFOL 500 MG/50ML IV EMUL
INTRAVENOUS | Status: DC | PRN
  Administered 2024-04-28 (×2): 150 ug/kg/min via INTRAVENOUS

## 2024-04-28 MED ORDER — GABAPENTIN 300 MG PO CAPS
600.0000 mg | ORAL_CAPSULE | Freq: Three times a day (TID) | ORAL | Status: DC
  Administered 2024-04-28 – 2024-04-29 (×3): 600 mg via ORAL
  Filled 2024-04-28 (×3): qty 2

## 2024-04-28 MED ORDER — ACETAMINOPHEN 500 MG PO TABS
1000.0000 mg | ORAL_TABLET | ORAL | Status: AC
  Administered 2024-04-28: 1000 mg via ORAL
  Filled 2024-04-28: qty 2

## 2024-04-28 MED ORDER — PROPOFOL 1000 MG/100ML IV EMUL
INTRAVENOUS | Status: AC
  Filled 2024-04-28: qty 300

## 2024-04-28 MED ORDER — SODIUM CHLORIDE 0.9% FLUSH: 3.0000 mL | Freq: Two times a day (BID) | INTRAVENOUS | Status: DC

## 2024-04-28 MED ORDER — HYDROMORPHONE HCL 1 MG/ML IJ SOLN
INTRAMUSCULAR | Status: AC
Start: 2024-04-28 — End: 2024-04-28
  Administered 2024-04-28: 0.5 mg
  Filled 2024-04-28: qty 0.5

## 2024-04-28 MED ORDER — HYDROMORPHONE HCL 1 MG/ML IJ SOLN
0.5000 mg | INTRAMUSCULAR | Status: DC | PRN
  Administered 2024-04-28: 0.5 mg via INTRAVENOUS
  Filled 2024-04-28: qty 0.5

## 2024-04-28 MED ORDER — THROMBIN 5000 UNITS EX KIT
PACK | CUTANEOUS | Status: AC
  Filled 2024-04-28: qty 1

## 2024-04-28 MED ORDER — MIDAZOLAM HCL 2 MG/2ML IJ SOLN
INTRAMUSCULAR | Status: DC | PRN
  Administered 2024-04-28: 2 mg via INTRAVENOUS

## 2024-04-28 MED ORDER — ORAL CARE MOUTH RINSE: 15.0000 mL | Freq: Once | OROMUCOSAL | Status: AC

## 2024-04-28 MED ORDER — INSULIN ASPART 100 UNIT/ML IJ SOLN
0.0000 [IU] | Freq: Every day | INTRAMUSCULAR | Status: DC
  Administered 2024-04-28: 10 [IU] via SUBCUTANEOUS

## 2024-04-28 MED ORDER — METHOCARBAMOL 500 MG PO TABS
500.0000 mg | ORAL_TABLET | Freq: Four times a day (QID) | ORAL | Status: DC | PRN
  Administered 2024-04-28: 500 mg via ORAL
  Filled 2024-04-28: qty 1

## 2024-04-28 MED ORDER — INSULIN ASPART 100 UNIT/ML IJ SOLN
0.0000 [IU] | INTRAMUSCULAR | Status: DC | PRN
Start: 2024-04-28 — End: 2024-04-28

## 2024-04-28 MED ORDER — ONDANSETRON HCL 4 MG/2ML IJ SOLN: 4.0000 mg | Freq: Four times a day (QID) | INTRAMUSCULAR | Status: DC | PRN

## 2024-04-28 MED ORDER — CEFAZOLIN SODIUM-DEXTROSE 2-4 GM/100ML-% IV SOLN
2.0000 g | INTRAVENOUS | Status: AC
  Administered 2024-04-28: 2 g via INTRAVENOUS
  Filled 2024-04-28: qty 100

## 2024-04-28 MED ORDER — PROPOFOL 10 MG/ML IV BOLUS
INTRAVENOUS | Status: AC
  Filled 2024-04-28: qty 20

## 2024-04-28 MED ORDER — ROPINIROLE HCL 1 MG PO TABS
1.5000 mg | ORAL_TABLET | Freq: Every day | ORAL | Status: DC
  Administered 2024-04-28: 1.5 mg via ORAL
  Filled 2024-04-28: qty 1

## 2024-04-28 MED ORDER — THROMBIN 20000 UNITS EX SOLR
CUTANEOUS | Status: AC
  Filled 2024-04-28: qty 20000

## 2024-04-28 MED ORDER — THROMBIN 20000 UNITS EX SOLR
CUTANEOUS | Status: DC | PRN
  Administered 2024-04-28: 20 mL via TOPICAL

## 2024-04-28 MED ORDER — SODIUM CHLORIDE 0.9% FLUSH
3.0000 mL | Freq: Two times a day (BID) | INTRAVENOUS | Status: DC
  Administered 2024-04-28 – 2024-04-29 (×2): 3 mL via INTRAVENOUS

## 2024-04-28 MED ORDER — CHLORHEXIDINE GLUCONATE CLOTH 2 % EX PADS
6.0000 | MEDICATED_PAD | Freq: Once | CUTANEOUS | Status: DC
Start: 2024-04-28 — End: 2024-04-28

## 2024-04-28 MED ORDER — PROPOFOL 1000 MG/100ML IV EMUL
INTRAVENOUS | Status: AC
  Filled 2024-04-28: qty 100

## 2024-04-28 MED ORDER — HYDROMORPHONE HCL 1 MG/ML IJ SOLN
0.2500 mg | INTRAMUSCULAR | Status: DC | PRN
  Administered 2024-04-28: 0.5 mg via INTRAVENOUS
  Administered 2024-04-28 (×2): 0.25 mg via INTRAVENOUS

## 2024-04-28 MED ORDER — FENTANYL CITRATE (PF) 250 MCG/5ML IJ SOLN: INTRAMUSCULAR | Status: DC | PRN

## 2024-04-28 MED ORDER — SODIUM CHLORIDE 0.9% FLUSH: 3.0000 mL | INTRAVENOUS | Status: DC | PRN

## 2024-04-28 MED ORDER — GABAPENTIN 300 MG PO CAPS
300.0000 mg | ORAL_CAPSULE | ORAL | Status: DC
  Filled 2024-04-28: qty 1

## 2024-04-28 MED ORDER — PIOGLITAZONE HCL 30 MG PO TABS
30.0000 mg | ORAL_TABLET | Freq: Every day | ORAL | Status: DC
  Administered 2024-04-28 – 2024-04-29 (×2): 30 mg via ORAL
  Filled 2024-04-28: qty 2
  Filled 2024-04-28 (×2): qty 1
  Filled 2024-04-28: qty 2

## 2024-04-28 MED ORDER — PHENOL 1.4 % MT LIQD: 1.0000 | OROMUCOSAL | Status: DC | PRN

## 2024-04-28 MED ORDER — ROCURONIUM BROMIDE 10 MG/ML (PF) SYRINGE
PREFILLED_SYRINGE | INTRAVENOUS | Status: AC
  Filled 2024-04-28: qty 10

## 2024-04-28 MED ORDER — BUSPIRONE HCL 10 MG PO TABS
10.0000 mg | ORAL_TABLET | Freq: Two times a day (BID) | ORAL | Status: DC
  Administered 2024-04-28 – 2024-04-29 (×2): 10 mg via ORAL
  Filled 2024-04-28 (×2): qty 1

## 2024-04-28 MED ORDER — PHENYLEPHRINE HCL-NACL 20-0.9 MG/250ML-% IV SOLN
INTRAVENOUS | Status: DC | PRN
  Administered 2024-04-28: 20 ug/min via INTRAVENOUS

## 2024-04-28 MED ORDER — TRAZODONE HCL 100 MG PO TABS: 100.0000 mg | ORAL_TABLET | Freq: Every evening | ORAL | Status: DC | PRN

## 2024-04-28 MED ORDER — LIDOCAINE 2% (20 MG/ML) 5 ML SYRINGE
INTRAMUSCULAR | Status: DC | PRN
  Administered 2024-04-28: 60 mg via INTRAVENOUS

## 2024-04-28 MED ORDER — FENTANYL CITRATE (PF) 250 MCG/5ML IJ SOLN
INTRAMUSCULAR | Status: DC | PRN
  Administered 2024-04-28 (×4): 50 ug via INTRAVENOUS

## 2024-04-28 MED ORDER — ACETAMINOPHEN 500 MG PO TABS
1000.0000 mg | ORAL_TABLET | Freq: Four times a day (QID) | ORAL | Status: DC
  Administered 2024-04-28: 1000 mg via ORAL
  Filled 2024-04-28: qty 2

## 2024-04-28 MED ORDER — 0.9 % SODIUM CHLORIDE (POUR BTL) OPTIME
TOPICAL | Status: DC | PRN
Start: 2024-04-28 — End: 2024-04-28
  Administered 2024-04-28: 1000 mL

## 2024-04-28 MED ORDER — CHLORHEXIDINE GLUCONATE CLOTH 2 % EX PADS: 6.0000 | MEDICATED_PAD | Freq: Once | CUTANEOUS | Status: DC

## 2024-04-28 MED ORDER — METFORMIN HCL ER 500 MG PO TB24
1000.0000 mg | ORAL_TABLET | Freq: Two times a day (BID) | ORAL | Status: DC
Start: 2024-04-28 — End: 2024-04-29
  Administered 2024-04-28 – 2024-04-29 (×2): 1000 mg via ORAL
  Filled 2024-04-28 (×2): qty 2

## 2024-04-28 MED ORDER — PAROXETINE HCL 30 MG PO TABS
30.0000 mg | ORAL_TABLET | Freq: Every morning | ORAL | Status: DC
Start: 2024-04-29 — End: 2024-04-29
  Administered 2024-04-29: 30 mg via ORAL
  Filled 2024-04-28: qty 1

## 2024-04-28 MED ORDER — CHLORHEXIDINE GLUCONATE 0.12 % MT SOLN
15.0000 mL | Freq: Once | OROMUCOSAL | Status: AC
  Administered 2024-04-28: 15 mL via OROMUCOSAL
  Filled 2024-04-28: qty 15

## 2024-04-28 MED ORDER — DEXAMETHASONE SODIUM PHOSPHATE 10 MG/ML IJ SOLN
INTRAMUSCULAR | Status: DC | PRN
  Administered 2024-04-28: 10 mg via INTRAVENOUS

## 2024-04-28 MED ORDER — CELECOXIB 200 MG PO CAPS
200.0000 mg | ORAL_CAPSULE | Freq: Two times a day (BID) | ORAL | Status: DC
  Administered 2024-04-28 – 2024-04-29 (×2): 200 mg via ORAL
  Filled 2024-04-28 (×2): qty 1

## 2024-04-28 MED ORDER — SUGAMMADEX SODIUM 200 MG/2ML IV SOLN
INTRAVENOUS | Status: DC | PRN
  Administered 2024-04-28: 200 mg via INTRAVENOUS

## 2024-04-28 MED ORDER — THROMBIN 5000 UNITS EX SOLR
OROMUCOSAL | Status: DC | PRN
  Administered 2024-04-28: 5 mL via TOPICAL

## 2024-04-28 MED ORDER — INSULIN ASPART 100 UNIT/ML IJ SOLN
0.0000 [IU] | Freq: Three times a day (TID) | INTRAMUSCULAR | Status: DC
  Administered 2024-04-28: 3 [IU] via SUBCUTANEOUS

## 2024-04-28 MED ORDER — HYDROMORPHONE HCL 1 MG/ML IJ SOLN
INTRAMUSCULAR | Status: AC
  Filled 2024-04-28: qty 1

## 2024-04-28 MED ORDER — SODIUM CHLORIDE 0.9 % IV SOLN
250.0000 mL | INTRAVENOUS | Status: DC
  Administered 2024-04-28: 250 mL via INTRAVENOUS

## 2024-04-28 MED ORDER — PANTOPRAZOLE SODIUM 40 MG PO TBEC
40.0000 mg | DELAYED_RELEASE_TABLET | Freq: Every day | ORAL | Status: DC
  Administered 2024-04-29: 40 mg via ORAL
  Filled 2024-04-28: qty 1

## 2024-04-28 MED ORDER — ONDANSETRON HCL 4 MG/2ML IJ SOLN
INTRAMUSCULAR | Status: AC
  Filled 2024-04-28: qty 2

## 2024-04-28 MED ORDER — ONDANSETRON HCL 4 MG PO TABS: 4.0000 mg | ORAL_TABLET | Freq: Four times a day (QID) | ORAL | Status: DC | PRN

## 2024-04-28 MED ORDER — ROCURONIUM BROMIDE 10 MG/ML (PF) SYRINGE
PREFILLED_SYRINGE | INTRAVENOUS | Status: DC | PRN
  Administered 2024-04-28: 10 mg via INTRAVENOUS
  Administered 2024-04-28: 20 mg via INTRAVENOUS
  Administered 2024-04-28: 30 mg via INTRAVENOUS
  Administered 2024-04-28: 50 mg via INTRAVENOUS

## 2024-04-28 MED ORDER — METOPROLOL TARTRATE 25 MG PO TABS
25.0000 mg | ORAL_TABLET | Freq: Two times a day (BID) | ORAL | Status: DC
  Administered 2024-04-28 – 2024-04-29 (×2): 25 mg via ORAL
  Filled 2024-04-28 (×2): qty 1

## 2024-04-28 MED ORDER — BUPIVACAINE HCL (PF) 0.25 % IJ SOLN
INTRAMUSCULAR | Status: DC | PRN
  Administered 2024-04-28: 8 mL

## 2024-04-28 MED ORDER — MAGNESIUM SULFATE IN D5W 1-5 GM/100ML-% IV SOLN
1.0000 g | Freq: Once | INTRAVENOUS | Status: AC
  Administered 2024-04-28: 1 g via INTRAVENOUS
  Filled 2024-04-28 (×2): qty 100

## 2024-04-28 SURGICAL SUPPLY — 56 items
BAG COUNTER SPONGE SURGICOUNT (BAG) ×1 IMPLANT
BASKET BONE COLLECTION (BASKET) ×1 IMPLANT
BENZOIN TINCTURE PRP APPL 2/3 (GAUZE/BANDAGES/DRESSINGS) ×1 IMPLANT
BIT DRILL PLIF MAS DISP 5.5MM (DRILL) IMPLANT
BLADE BONE MILL MEDIUM (MISCELLANEOUS) ×1 IMPLANT
BLADE CLIPPER SURG (BLADE) IMPLANT
BONE MATRIX OSTEOCEL PRO MED (Bone Implant) IMPLANT
BUR CARBIDE MATCH 3.0 (BURR) ×1 IMPLANT
CAGE COROENT LG 10X9X23-12 (Cage) IMPLANT
CANISTER SUCTION 3000ML PPV (SUCTIONS) ×1 IMPLANT
CLEANSER WND VASHE 34 (WOUND CARE) ×1 IMPLANT
CNTNR URN SCR LID CUP LEK RST (MISCELLANEOUS) ×1 IMPLANT
COVER BACK TABLE 60X90IN (DRAPES) ×1 IMPLANT
DERMABOND ADVANCED .7 DNX12 (GAUZE/BANDAGES/DRESSINGS) ×1 IMPLANT
DRAPE C-ARM 42X72 X-RAY (DRAPES) ×2 IMPLANT
DRAPE C-ARMOR (DRAPES) ×1 IMPLANT
DRAPE LAPAROTOMY 100X72X124 (DRAPES) ×1 IMPLANT
DRAPE SURG 17X23 STRL (DRAPES) ×1 IMPLANT
DRSG OPSITE POSTOP 4X6 (GAUZE/BANDAGES/DRESSINGS) IMPLANT
DURAPREP 26ML APPLICATOR (WOUND CARE) ×1 IMPLANT
ELECTRODE REM PT RTRN 9FT ADLT (ELECTROSURGICAL) ×1 IMPLANT
EVACUATOR 1/8 PVC DRAIN (DRAIN) ×1 IMPLANT
GAUZE 4X4 16PLY ~~LOC~~+RFID DBL (SPONGE) IMPLANT
GLOVE BIO SURGEON STRL SZ7 (GLOVE) IMPLANT
GLOVE BIO SURGEON STRL SZ8 (GLOVE) ×2 IMPLANT
GLOVE BIOGEL PI IND STRL 7.0 (GLOVE) IMPLANT
GLOVE SURG SS PI 6.5 STRL IVOR (GLOVE) IMPLANT
GOWN STRL REUS W/ TWL LRG LVL3 (GOWN DISPOSABLE) IMPLANT
GOWN STRL REUS W/ TWL XL LVL3 (GOWN DISPOSABLE) ×2 IMPLANT
GOWN STRL REUS W/TWL 2XL LVL3 (GOWN DISPOSABLE) IMPLANT
HEMOSTAT POWDER KIT SURGIFOAM (HEMOSTASIS) ×1 IMPLANT
KIT BASIN OR (CUSTOM PROCEDURE TRAY) ×1 IMPLANT
KIT TURNOVER KIT B (KITS) ×1 IMPLANT
MILL BONE PREP (MISCELLANEOUS) ×1 IMPLANT
NDL HYPO 25X1 1.5 SAFETY (NEEDLE) ×1 IMPLANT
NEEDLE HYPO 25X1 1.5 SAFETY (NEEDLE) ×1 IMPLANT
NS IRRIG 1000ML POUR BTL (IV SOLUTION) ×1 IMPLANT
PACK LAMINECTOMY NEURO (CUSTOM PROCEDURE TRAY) ×1 IMPLANT
PAD ARMBOARD POSITIONER FOAM (MISCELLANEOUS) ×3 IMPLANT
PATTIES SURGICAL .5 X.5 (GAUZE/BANDAGES/DRESSINGS) ×1 IMPLANT
PATTIES SURGICAL 1X1 (DISPOSABLE) ×1 IMPLANT
POWDER MYRIAD MORCLLS FINE 500 (Miscellaneous) IMPLANT
ROD 5.5X40MM (Rod) IMPLANT
SCREW LOCK FXNS SPNE MAS PL (Screw) IMPLANT
SCREW SHANK 6.5X40 NS LF (Screw) IMPLANT
SCREW TULIP 5.5 (Screw) IMPLANT
SPONGE SURGIFOAM ABS GEL 100 (HEMOSTASIS) ×1 IMPLANT
SPONGE T-LAP 4X18 ~~LOC~~+RFID (SPONGE) IMPLANT
STRIP CLOSURE SKIN 1/2X4 (GAUZE/BANDAGES/DRESSINGS) ×1 IMPLANT
SUT VIC AB 0 CT1 18XCR BRD8 (SUTURE) ×1 IMPLANT
SUT VIC AB 2-0 CP2 18 (SUTURE) ×1 IMPLANT
SUT VIC AB 3-0 SH 8-18 (SUTURE) ×2 IMPLANT
SYR 30ML SLIP (SYRINGE) ×1 IMPLANT
TOWEL GREEN STERILE (TOWEL DISPOSABLE) ×1 IMPLANT
TOWEL GREEN STERILE FF (TOWEL DISPOSABLE) ×1 IMPLANT
WATER STERILE IRR 1000ML POUR (IV SOLUTION) ×1 IMPLANT

## 2024-04-28 NOTE — Transfer of Care (Signed)
 Immediate Anesthesia Transfer of Care Note  Patient: Gabrielle Esparza  Procedure(s) Performed: POSTERIOR LUMBAR INTERBODY FUSION  - LUMBAR FIVE-SACRAL ONE - Posterior Lateral and Interbody fusion, extension of hardware (Back)  Patient Location: PACU  Anesthesia Type:General  Level of Consciousness: oriented and drowsy  Airway & Oxygen Therapy: Patient Spontanous Breathing and Patient connected to face mask oxygen  Post-op Assessment: Report given to RN and Post -op Vital signs reviewed and stable  Post vital signs: Reviewed and stable  Last Vitals:  Vitals Value Taken Time  BP 129/57 04/28/24 1516  Temp 37.2 C 04/28/24 1515  Pulse 65 04/28/24 1520  Resp 14 04/28/24 1520  SpO2 84 % 04/28/24 1520  Vitals shown include unfiled device data.  Last Pain:  Vitals:   04/28/24 1016  TempSrc:   PainSc: 10-Worst pain ever      Patients Stated Pain Goal: 2 (04/28/24 1016)  Complications: No notable events documented.

## 2024-04-28 NOTE — Op Note (Signed)
 04/28/2024  3:04 PM  PATIENT:  Gabrielle Esparza  68 y.o. female  PRE-OPERATIVE DIAGNOSIS: Spondylolisthesis L5-S1, back pain with radiculopathy  POST-OPERATIVE DIAGNOSIS:  same  PROCEDURE:   1. Decompressive lumbar laminectomy, hemi facetectomy and foraminotomies L5-S1 requiring more work than would be required for a simple exposure of the disk for PLIF in order to adequately decompress the neural elements and address the spinal stenosis 2. Posterior lumbar interbody fusion L5-S1 using peek interbody cages packed with morcellized allograft and autograft  3. Posterior fixation L5-S1 using NuVasive cortical pedicle screws.  4. Intertransverse arthrodesis L5-S1 using morcellized autograft and allograft. 5.  Exploration of fusion L4-5 with removal of nonsegmental instrumentation  SURGEON:  Waymond Hailey, MD  ASSISTANTS: Jackee Marus, FNP  ANESTHESIA:  General  EBL: 50 ml  No intake/output data recorded.  BLOOD ADMINISTERED:none  DRAINS: none   INDICATION FOR PROCEDURE: This patient presented with back pain with radiculopathy. Imaging revealed spondylolisthesis L5-S1 with a right-sided foraminal disc herniation compressing the right L5 nerve root. The patient tried a reasonable attempt at conservative medical measures without relief. I recommended decompression and instrumented fusion to address the stenosis as well as the segmental  instability.  Patient understood the risks, benefits, and alternatives and potential outcomes and wished to proceed.  PROCEDURE DETAILS:  The patient was brought to the operating room. After induction of generalized endotracheal anesthesia the patient was rolled into the prone position on chest rolls and all pressure points were padded. The patient's lumbar region was cleaned and then prepped with DuraPrep and draped in the usual sterile fashion. Anesthesia was injected and then a dorsal midline incision was made and carried down to the lumbosacral fascia.  The fascia was opened and the paraspinous musculature was taken down in a subperiosteal fashion to expose L5-S1 as well as the previously placed instrumentation. A self-retaining retractor was placed.  We removed the locking caps from the L4 and the L5 screws and remove the rods.  The screws had excellent purchase.  Intraoperative fluoroscopy confirmed my level, and I started with the decompression at L5-S1   I then turned my attention to the decompression and complete lumbar laminectomies, hemi- facetectomies, and foraminotomies were performed at L5-S1 bilaterally.  My nurse practitioner was directly involved in the decompression and exposure of the neural elements. the patient had significant spinal stenosis and this required more work than would be required for a simple exposure of the disc for posterior lumbar interbody fusion which would only require a limited laminotomy. Much more generous decompression and generous foraminotomy was undertaken in order to adequately decompress the neural elements and address the patient's leg pain. The yellow ligament was removed to expose the underlying dura and nerve roots, and generous foraminotomies were performed to adequately decompress the neural elements. Both the exiting and traversing nerve roots were decompressed on both sides until a coronary dilator passed easily along the nerve roots. Once the decompression was complete, I turned my attention to the posterior lower lumbar interbody fusion. The epidural venous vasculature was coagulated and cut sharply. Disc space was incised and the initial discectomy was performed with pituitary rongeurs. The disc space was distracted with sequential distractors to a height of 10 mm. We then used a series of scrapers and shavers to prepare the endplates for fusion. The midline was prepared with Epstein curettes. Once the complete discectomy was finished, we packed an appropriate sized interbody cage with local autograft and  morcellized allograft, gently retracted the nerve root,  and tapped the cage into position at L5-S1.  The midline between the cages was packed with morselized autograft and allograft.   We then turned our attention to the placement of the lower pedicle screws. The pedicle screw entry zones were identified utilizing surface landmarks and fluoroscopy. I drilled into each pedicle utilizing the hand drill, and tapped each pedicle with the appropriate tap. We palpated with a ball probe to assure no break in the cortex. We then placed 0.5 x 40 mm pedicle screws into the pedicles bilaterally at S1.  My nurse practitioner assisted in placement of the pedicle screws.  We then decorticated the transverse processes and laid a mixture of morcellized autograft and allograft out over these to perform intertransverse arthrodesis at L5-S1. We then placed lordotic rods into the multiaxial screw heads of the pedicle screws and locked these in position with the locking caps and anti-torque device. We then checked our construct with AP and lateral fluoroscopy. Irrigated with copious amounts of saline solution. Inspected the nerve roots once again to assure adequate decompression, lined to the dura with Gelfoam,  and then we closed the muscle and the fascia with 0 Vicryl. Closed the subcutaneous tissues with 2-0 Vicryl and subcuticular tissues with 3-0 Vicryl. The skin was closed with benzoin and Steri-Strips. Dressing was then applied, the patient was awakened from general anesthesia and transported to the recovery room in stable condition. At the end of the procedure all sponge, needle and instrument counts were correct.   PLAN OF CARE: admit to inpatient  PATIENT DISPOSITION:  PACU - hemodynamically stable.   Delay start of Pharmacological VTE agent (>24hrs) due to surgical blood loss or risk of bleeding:  yes

## 2024-04-28 NOTE — Anesthesia Postprocedure Evaluation (Signed)
 Anesthesia Post Note  Patient: AUBRIEL KHANNA  Procedure(s) Performed: POSTERIOR LUMBAR INTERBODY FUSION  - LUMBAR FIVE-SACRAL ONE - Posterior Lateral and Interbody fusion, extension of hardware (Back)     Patient location during evaluation: PACU Anesthesia Type: General Level of consciousness: awake and alert Pain management: pain level controlled Vital Signs Assessment: post-procedure vital signs reviewed and stable Respiratory status: spontaneous breathing, nonlabored ventilation and respiratory function stable Cardiovascular status: blood pressure returned to baseline and stable Postop Assessment: no apparent nausea or vomiting Anesthetic complications: no  No notable events documented.  Last Vitals:  Vitals:   04/28/24 1645 04/28/24 1707  BP: (!) 122/58 118/62  Pulse: 60 (!) 59  Resp: 13 18  Temp: 36.9 C 36.6 C  SpO2: 95% 95%    Last Pain:  Vitals:   04/28/24 1609  TempSrc:   PainSc: 8                  Ahtziry Saathoff,W. EDMOND

## 2024-04-28 NOTE — Anesthesia Procedure Notes (Signed)
 Procedure Name: Intubation Date/Time: 04/28/2024 12:37 PM  Performed by: Linard Reno, CRNAPre-anesthesia Checklist: Patient identified, Emergency Drugs available, Suction available and Patient being monitored Patient Re-evaluated:Patient Re-evaluated prior to induction Oxygen Delivery Method: Circle System Utilized Preoxygenation: Pre-oxygenation with 100% oxygen Induction Type: IV induction Ventilation: Mask ventilation without difficulty Laryngoscope Size: Mac and 3 Grade View: Grade I Tube type: Oral Tube size: 7.0 mm Number of attempts: 1 Airway Equipment and Method: Stylet Placement Confirmation: ETT inserted through vocal cords under direct vision, positive ETCO2 and breath sounds checked- equal and bilateral Secured at: 21 cm Tube secured with: Tape Dental Injury: Teeth and Oropharynx as per pre-operative assessment  Comments: Intubation by Darlina Einstein, SRNA.

## 2024-04-28 NOTE — H&P (Signed)
 Subjective: Patient is a 68 y.o. female admitted for pain with leg pain. Onset of symptoms was several months ago, gradually worsening since that time.  The pain is rated severe, and is located at the across the lower back and radiates to legs. The pain is described as aching and occurs all day. The symptoms have been progressive. Symptoms are exacerbated by exercise and standing. MRI or CT showed adjacent level disease L5-S1 with spinal stenosis and instability  Past Medical History:  Diagnosis Date   Anxiety    Arthritis    OA,- hands (RA)& back     Atherosclerosis    Constipation    Diabetes mellitus without complication (HCC)    type 2   DOE (dyspnea on exertion)    Fibromyalgia    takes cymbalta  and gabapentin    GERD (gastroesophageal reflux disease)    takes nexium   Headache    headaches and migraines   Hypertension    well-controlled   Low iron    hx of - no meds now   PONV (postoperative nausea and vomiting)    Sleep apnea    does not use CPAP    Past Surgical History:  Procedure Laterality Date   ABDOMINAL HYSTERECTOMY     APPENDECTOMY     BACK SURGERY     lumbar decompression    BILATERAL CARPAL TUNNEL RELEASE     BLADDER SUSPENSION     CHOLECYSTECTOMY     COLONOSCOPY     LUMBAR LAMINECTOMY/DECOMPRESSION MICRODISCECTOMY Right 12/01/2015   Procedure:  extraforaminal Microdiscectomy  - Right Lumbar three-lumbar four ;  Surgeon: Isadora Mar, MD;  Location: [facility];  Service: Neurosurgery;  Laterality: Right;   TRANSFORAMINAL LUMBAR INTERBODY FUSION (TLIF) WITH PEDICLE SCREW FIXATION 1 LEVEL Right 03/13/2016   Procedure: Right LUMBAR THREE- FOUR TRANSFORAMINAL LUMBAR INTERBODY FUSION (TLIF) WITH PEDICLE SCREW FIXATION ;  Surgeon: Isadora Mar, MD;  Location: [facility];  Service: Neurosurgery;  Laterality: Right;   TRIGGER FINGER RELEASE Right    index finger    Prior to Admission medications   Medication Sig Start Date End Date Taking? Authorizing Provider   busPIRone  (BUSPAR ) 10 MG tablet Take 10 mg by mouth 2 (two) times daily.   Yes [provider]  celecoxib  (CELEBREX ) 200 MG capsule Take 200 mg by mouth 2 (two) times daily.   Yes [provider]  esomeprazole (NEXIUM) 40 MG capsule Take 40 mg by mouth daily before breakfast.    Yes [provider]  furosemide  (LASIX ) 20 MG tablet Take 20 mg by mouth 2 (two) times daily.   Yes [provider]  gabapentin  (NEURONTIN ) 600 MG tablet Take 600 mg by mouth 3 (three) times daily.   Yes [provider]  insulin  glargine (LANTUS) 100 UNIT/ML injection Inject 30 Units into the skin at bedtime.   Yes [provider]  metFORMIN  (GLUCOPHAGE -XR) 500 MG 24 hr tablet Take 1,000 mg by mouth 2 (two) times daily.   Yes [provider]  methocarbamol (ROBAXIN) 500 MG tablet Take 500 mg by mouth 3 (three) times daily. 08/25/23  Yes [provider]  metoprolol tartrate (LOPRESSOR) 25 MG tablet Take 25 mg by mouth 2 (two) times daily.   Yes [provider]  montelukast (SINGULAIR) 10 MG tablet Take 10 mg by mouth daily.   Yes [provider]  pioglitazone (ACTOS) 30 MG tablet Take 1 tablet by mouth daily. 03/02/24  Yes [provider]  rOPINIRole (  REQUIP) 0.5 MG tablet Take 1.5 mg by mouth at bedtime.   Yes [provider]  traMADol (ULTRAM) 50 MG tablet Take 50 mg by mouth every 8 (eight) hours as needed for moderate pain.   Yes [provider]  cyclobenzaprine  (FLEXERIL ) 10 MG tablet Take 1 tablet (10 mg total) by mouth 3 (three) times daily as needed for muscle spasms. Patient not taking: Reported on 03/26/2024 03/15/16   Joaquin Mulberry, MD  HYDROcodone -acetaminophen  [facility]) 10-325 MG tablet Take 1-2 tablets by mouth every 6 (six) hours as needed for moderate pain. Patient not taking: Reported on 03/26/2024 06/13/17   Joaquin Mulberry, MD  PARoxetine (PAXIL) 30 MG tablet Take 30 mg by mouth every  morning.    [provider]  traZODone (DESYREL) 100 MG tablet Take 100 mg by mouth at bedtime as needed for sleep.    [provider]   Allergies  Allergen Reactions   Amoxicillin Itching and Rash    unknown   Statins Nausea And Vomiting, Rash and Other (See Comments)    MYALGIAS   Cephalexin Nausea Only and Rash    Social History   Tobacco Use   Smoking status: Never   Smokeless tobacco: Never  Substance Use Topics   Alcohol use: Not Currently    Family History  Problem Relation Age of Onset   Cancer Mother    Heart attack Father      Review of Systems  Positive ROS: Negative  All other systems have been reviewed and were otherwise negative with the exception of those mentioned in the HPI and as above.  Objective: Vital signs in last 24 hours: Temp:  [98.2 F (36.8 C)] 98.2 F (36.8 C) (05/21 0929) Pulse Rate:  [55] 55 (05/21 0929) Resp:  [18] 18 (05/21 0929) BP: (150)/(60) 150/60 (05/21 0929) SpO2:  [98 %] 98 % (05/21 0929) Weight:  [106.6 kg] 106.6 kg (05/21 0929)  General Appearance: Alert, cooperative, no distress, appears stated age Head: Normocephalic, without obvious abnormality, atraumatic Eyes: PERRL, conjunctiva/corneas clear, EOM's intact    Neck: Supple, symmetrical, trachea midline Back: Symmetric, no curvature, ROM normal, no CVA tenderness Lungs:  respirations unlabored Heart: Regular rate and rhythm Abdomen: Soft, non-tender Extremities: Extremities normal, atraumatic, no cyanosis or edema Pulses: 2+ and symmetric all extremities Skin: Skin color, texture, turgor normal, no rashes or lesions  NEUROLOGIC:   Mental status: Alert and oriented x4,  no aphasia, good attention span, fund of knowledge, and memory Motor Exam - grossly normal Sensory Exam - grossly normal Reflexes: 1+ Coordination - grossly normal Gait - grossly normal Balance - grossly normal Cranial Nerves: I: smell Not tested  II: visual acuity  OS: nl     OD: nl  II: visual fields Full to confrontation  II: pupils Equal, round, reactive to light  III,VII: ptosis None  III,IV,VI: extraocular muscles  Full ROM  V: mastication Normal  V: facial light touch sensation  Normal  V,VII: corneal reflex  Present  VII: facial muscle function - upper  Normal  VII: facial muscle function - lower Normal  VIII: hearing Not tested  IX: soft palate elevation  Normal  IX,X: gag reflex Present  XI: trapezius strength  5/5  XI: sternocleidomastoid strength 5/5  XI: neck flexion strength  5/5  XII: tongue strength  Normal    Data Review Lab Results  Component Value Date   WBC 8.3 04/01/2024   HGB 11.0 (L) 04/01/2024   HCT 35.0 (L)  04/01/2024   MCV 92.8 04/01/2024   PLT 215 04/01/2024   Lab Results  Component Value Date   NA 141 04/01/2024   K 5.3 (H) 04/01/2024   CL 104 04/01/2024   CO2 28 04/01/2024   BUN 18 04/01/2024   CREATININE 1.42 (H) 04/01/2024   GLUCOSE 83 04/01/2024   Lab Results  Component Value Date   INR 1.0 04/01/2024    Assessment/Plan:  Estimated body mass index is 40.34 kg/m as calculated from the following:   Height as of this encounter: 5\' 4"  (1.626 m).   Weight as of this encounter: 106.6 kg. Patient admitted for PLIF L5-S1 with extension of instrumentation. Patient has failed a reasonable attempt at conservative therapy.  I explained the condition and procedure to the patient and answered any questions.  Patient wishes to proceed with procedure as planned. Understands risks/ benefits and typical outcomes of procedure.   Isadora Mar 04/28/2024 11:51 AM

## 2024-04-28 NOTE — Plan of Care (Signed)

## 2024-04-28 NOTE — Anesthesia Preprocedure Evaluation (Addendum)
 Anesthesia Evaluation  Patient identified by MRN, date of birth, ID band Patient awake    Reviewed: Allergy & Precautions, H&P , NPO status , Patient's Chart, lab work & pertinent test results, reviewed documented beta blocker date and time   History of Anesthesia Complications (+) PONV and history of anesthetic complications  Airway Mallampati: II  TM Distance: >3 FB Neck ROM: Full    Dental no notable dental hx. (+) Teeth Intact, Dental Advisory Given   Pulmonary sleep apnea    Pulmonary exam normal breath sounds clear to auscultation       Cardiovascular hypertension, Pt. on medications and Pt. on home beta blockers  Rhythm:Regular Rate:Normal     Neuro/Psych  Headaches  Anxiety        GI/Hepatic Neg liver ROS,GERD  Medicated,,  Endo/Other  diabetes, Insulin  Dependent, Oral Hypoglycemic Agents  Class 3 obesity  Renal/GU negative Renal ROS  negative genitourinary   Musculoskeletal  (+) Arthritis , Osteoarthritis,  Fibromyalgia -  Abdominal   Peds  Hematology negative hematology ROS (+)   Anesthesia Other Findings   Reproductive/Obstetrics negative OB ROS                             Anesthesia Physical Anesthesia Plan  ASA: 3  Anesthesia Plan: General   Post-op Pain Management: Tylenol  PO (pre-op)*   Induction: Intravenous  PONV Risk Score and Plan: 4 or greater and Ondansetron , Dexamethasone , Propofol  infusion and TIVA  Airway Management Planned: Oral ETT  Additional Equipment:   Intra-op Plan:   Post-operative Plan: Extubation in OR  Informed Consent: I have reviewed the patients History and Physical, chart, labs and discussed the procedure including the risks, benefits and alternatives for the proposed anesthesia with the patient or authorized representative who has indicated his/her understanding and acceptance.     Dental advisory given  Plan Discussed with:  CRNA  Anesthesia Plan Comments:        Anesthesia Quick Evaluation

## 2024-04-29 DIAGNOSIS — M4317 Spondylolisthesis, lumbosacral region: Secondary | ICD-10-CM | POA: Diagnosis not present

## 2024-04-29 LAB — GLUCOSE, CAPILLARY: Glucose-Capillary: 209 mg/dL — ABNORMAL HIGH (ref 70–99)

## 2024-04-29 MED ORDER — METHOCARBAMOL 500 MG PO TABS: 500.0000 mg | ORAL_TABLET | Freq: Three times a day (TID) | ORAL | 1 refills | Status: DC | PRN

## 2024-04-29 MED ORDER — HYDROCODONE-ACETAMINOPHEN 10-325 MG PO TABS: 1.0000 | ORAL_TABLET | Freq: Four times a day (QID) | ORAL | 0 refills | Status: DC | PRN

## 2024-04-29 MED FILL — Thrombin (Recombinant) For Soln 20000 Unit: CUTANEOUS | Qty: 1 | Status: AC

## 2024-04-29 NOTE — Progress Notes (Signed)
 Patient awaiting family for discharge home, Patient in no acute distress nor complaints of pain nor discomfort; incision on back is clean, dry and intact; No c/o pain at this time. Room was checked and accounted for all patient's belongings; discharge instructions concerning her medications, incision care, follow up appointment and when to call the doctor as needed were all discussed with patient by RN and she expressed understanding on the instructions given.

## 2024-04-29 NOTE — Evaluation (Signed)
 Occupational Therapy Evaluation and Discharge Patient Details Name: Gabrielle Esparza MRN: [id number] DOB: Feb 20, 1956 Today's Date: 04/29/2024   History of Present Illness   Pt is a 68 yo female admitted and underwent decompressive lumbar laminectomy, hemi facetectomy and foraminotomies L5-S1 and fusion L5-S1; as well as exploration of fusion L4-5 with removal of nonsegmental instrumentation. PHMx: anxiety, OA/RA (hand and back), DM2, DOE,fibromyalgia,HTN, lumbar sx     Clinical Impressions This 68 yo female admitted and underwent above presents to acute OT with all education completed and post op back handout reviewed. No further OT needs, we will sign off,     If plan is discharge home, recommend the following:   Assistance with cooking/housework;Assist for transportation     Functional Status Assessment   Patient has had a recent decline in their functional status and demonstrates the ability to make significant improvements in function in a reasonable and predictable amount of time. (without further need for skilled OT)     Equipment Recommendations   Tub/shower seat;BSC/3in1;Other (comment) (rollator)      Precautions/Restrictions   Precautions Precautions: Back Precaution Booklet Issued: Yes (comment) Recall of Precautions/Restrictions: Intact Required Braces or Orthoses: Spinal Brace Spinal Brace: Lumbar corset;Applied in sitting position;Applied in standing position Restrictions Weight Bearing Restrictions Per Provider Order: No     Mobility Bed Mobility Overal bed mobility: Modified Independent                  Transfers Overall transfer level: Independent                        Balance Overall balance assessment: Mild deficits observed, not formally tested                                         ADL either performed or assessed with clinical judgement   ADL                                          General ADL Comments: Educated and provided handout for use of 2 cups for mouth care, use of wet wipes for back peri care, sequence of dressing, use of pillows for positioning, building up sitting tolerance, side stepping into tub bending knee back not hip forward, and bed mobiltiy. Educated on use of AE (sock aid, reacher, and long handled sponge and provided pt with these items)     Vision Patient Visual Report: No change from baseline              Pertinent Vitals/Pain Pain Assessment Pain Assessment: Faces Faces Pain Scale: Hurts little more Pain Location: incisional Pain Descriptors / Indicators: Sore Pain Intervention(s): Limited activity within patient's tolerance, Monitored during session     Extremity/Trunk Assessment Upper Extremity Assessment Upper Extremity Assessment: Overall WFL for tasks assessed           Communication Communication Communication: No apparent difficulties   Cognition Arousal: Alert Behavior During Therapy: WFL for tasks assessed/performed Cognition: No apparent impairments                               Following commands: Intact       Cueing    Cueing Techniques: Verbal cues  Home Living Family/patient expects to be discharged to:: Private residence Living Arrangements: Spouse/significant other;Other relatives Available Help at Discharge: Family;Available 24 hours/day Type of Home: House             Bathroom Shower/Tub: Tub/shower unit;Curtain   Bathroom Toilet: Standard     Home Equipment: Hand held shower head          Prior Functioning/Environment Prior Level of Function : Independent/Modified Independent                    OT Problem List: Decreased range of motion;Impaired balance (sitting and/or standing);Pain        OT Goals(Current goals can be found in the care plan section)   Acute Rehab OT Goals Patient Stated Goal: to go home today         AM-PAC OT "6  Clicks" Daily Activity     Outcome Measure Help from another person eating meals?: None Help from another person taking care of personal grooming?: None Help from another person toileting, which includes using toliet, bedpan, or urinal?: None Help from another person bathing (including washing, rinsing, drying)?: None Help from another person to put on and taking off regular upper body clothing?: None Help from another person to put on and taking off regular lower body clothing?: None 6 Click Score: 24   End of Session Equipment Utilized During Treatment: Rolling walker (2 wheels) (and none) Nurse Communication:  (pt needs a rollator, a 3n1, and a shower chair)  Activity Tolerance: Patient tolerated treatment well Patient left: in chair;with call bell/phone within reach  OT Visit Diagnosis: Unsteadiness on feet (R26.81);Pain Pain - part of body:  (incisional)                Time: 4098-1191 OT Time Calculation (min): 36 min Charges:  OT General Charges $OT Visit: 1 Visit OT Evaluation $OT Eval Moderate Complexity: 1 Mod OT Treatments $Self Care/Home Management : 8-22 mins  Merryl Abraham OT Acute Rehabilitation Services Office [phone]    Lenox Raider 04/29/2024, 12:41 PM

## 2024-04-29 NOTE — Evaluation (Signed)
 Physical Therapy Evaluation  Patient Details Name: Gabrielle Esparza MRN: [id number] DOB: 1956/10/23 Today's Date: 04/29/2024  History of Present Illness  Pt is a 68 yo female admitted and underwent decompressive lumbar laminectomy, hemi facetectomy and foraminotomies L5-S1 and fusion L5-S1; as well as exploration of fusion L4-5 with removal of nonsegmental instrumentation. PHMx: anxiety, OA/RA (hand and back), DM2, DOE,fibromyalgia,HTN, lumbar sx   Clinical Impression  Pt admitted with above diagnosis. At the time of PT eval, pt was able to demonstrate transfers and ambulation with gross modified independence to supervision for safety and RW for support. Attempted mobility without AD but gait pattern and posture suffered. Prefer pt with RW at this time. Pt was educated on precautions, brace application/wearing schedule, appropriate activity progression, and car transfer. Pt currently with functional limitations due to the deficits listed below (see PT Problem List). Pt will benefit from skilled PT to increase their independence and safety with mobility to allow discharge to the venue listed below.          If plan is discharge home, recommend the following: A little help with walking and/or transfers;A little help with bathing/dressing/bathroom;Assistance with cooking/housework;Assist for transportation;Help with stairs or ramp for entrance   Can travel by private vehicle        Equipment Recommendations Rolling walker (2 wheels)  Recommendations for Other Services       Functional Status Assessment Patient has had a recent decline in their functional status and demonstrates the ability to make significant improvements in function in a reasonable and predictable amount of time.     Precautions / Restrictions Precautions Precautions: Back Precaution Booklet Issued: Yes (comment) Recall of Precautions/Restrictions: Intact Precaution/Restrictions Comments: Reviewed handout and pt was cued  for precautions during functional mobility. Required Braces or Orthoses: Spinal Brace Spinal Brace: Lumbar corset;Applied in sitting position;Applied in standing position Restrictions Weight Bearing Restrictions Per Provider Order: No      Mobility  Bed Mobility Overal bed mobility: Modified Independent             General bed mobility comments: HOB flat and rails lowered to simulate home environment.    Transfers Overall transfer level: Modified independent Equipment used: Rolling walker (2 wheels), None               General transfer comment: VC's for hand placement on seated surface for safety. Pt able to stand without using RW but prefers to have something to hold to.    Ambulation/Gait Ambulation/Gait assistance: Supervision Gait Distance (Feet): 400 Feet Assistive device: Rolling walker (2 wheels), None Gait Pattern/deviations: Step-through pattern, Decreased stride length, Trunk flexed, Trendelenburg Gait velocity: Decreased Gait velocity interpretation: <1.31 ft/sec, indicative of household ambulator   General Gait Details: VC's for improved posture, closer walker proximity and forward gaze. Pt eager to attempt without AD, however without walker support, pt with increased lateral sway and mild trendelenberg gait pattern. Overall improved with RW.  Stairs Stairs: Yes Stairs assistance: Contact guard assist Stair Management: One rail Right, Step to pattern, Forwards Number of Stairs: 3 General stair comments: VC's for sequencing and general safety.  Wheelchair Mobility     Tilt Bed    Modified Rankin (Stroke Patients Only)       Balance Overall balance assessment: Mild deficits observed, not formally tested  Pertinent Vitals/Pain Pain Assessment Pain Assessment: Faces Faces Pain Scale: Hurts little more Pain Location: incisional Pain Descriptors / Indicators: Sore Pain  Intervention(s): Limited activity within patient's tolerance, Monitored during session, Repositioned    Home Living Family/patient expects to be discharged to:: Private residence Living Arrangements: Spouse/significant other;Other relatives Available Help at Discharge: Family;Available 24 hours/day Type of Home: House Home Access: Ramped entrance;Stairs to enter   Entrance Stairs-Number of Steps: 3   Home Layout: One level Home Equipment: Hand held shower head      Prior Function Prior Level of Function : Independent/Modified Independent                     Extremity/Trunk Assessment   Upper Extremity Assessment Upper Extremity Assessment: Overall WFL for tasks assessed    Lower Extremity Assessment Lower Extremity Assessment: Generalized weakness (Mild; consistent with pre-op diagnosis)    Cervical / Trunk Assessment Cervical / Trunk Assessment: Back Surgery  Communication   Communication Communication: No apparent difficulties    Cognition Arousal: Alert Behavior During Therapy: WFL for tasks assessed/performed   PT - Cognitive impairments: No apparent impairments                         Following commands: Intact       Cueing Cueing Techniques: Verbal cues     General Comments      Exercises     Assessment/Plan    PT Assessment Patient needs continued PT services  PT Problem List Decreased strength;Decreased activity tolerance;Decreased balance;Decreased mobility;Decreased knowledge of use of DME;Decreased safety awareness;Decreased knowledge of precautions;Pain       PT Treatment Interventions DME instruction;Gait training;Functional mobility training;Stair training;Therapeutic activities;Therapeutic exercise;Balance training;Patient/family education    PT Goals (Current goals can be found in the Care Plan section)  Acute Rehab PT Goals Patient Stated Goal: Home today PT Goal Formulation: With patient Time For Goal Achievement:  05/06/24 Potential to Achieve Goals: Good    Frequency Min 5X/week     Co-evaluation               AM-PAC PT "6 Clicks" Mobility  Outcome Measure Help needed turning from your back to your side while in a flat bed without using bedrails?: None Help needed moving from lying on your back to sitting on the side of a flat bed without using bedrails?: None Help needed moving to and from a bed to a chair (including a wheelchair)?: A Little Help needed standing up from a chair using your arms (e.g., wheelchair or bedside chair)?: None Help needed to walk in hospital room?: A Little Help needed climbing 3-5 steps with a railing? : A Little 6 Click Score: 21    End of Session Equipment Utilized During Treatment: Gait belt;Back brace Activity Tolerance: Patient tolerated treatment well Patient left: in bed;with call bell/phone within reach Nurse Communication: Mobility status PT Visit Diagnosis: Unsteadiness on feet (R26.81);Pain Pain - part of body:  (back)    Time: 1610-9604 PT Time Calculation (min) (ACUTE ONLY): 20 min   Charges:   PT Evaluation $PT Eval Low Complexity: 1 Low   PT General Charges $$ ACUTE PT VISIT: 1 Visit         Simone Dubois, PT, DPT Acute Rehabilitation Services Secure Chat Preferred Office: [phone]   Venus Ginsberg 04/29/2024, 2:09 PM

## 2024-04-29 NOTE — Discharge Summary (Signed)
 Physician Discharge Summary  Patient ID: Gabrielle Esparza MRN: [id number] DOB/AGE: 08/15/1956 68 y.o.  Admit date: 04/28/2024 Discharge date: 04/29/2024  Admission Diagnoses: spondylolisthesis L5-S1, HNP with radiculopathy    Discharge Diagnoses: same   Discharged Condition: good  Hospital Course: The patient was admitted on 04/28/2024 and taken to the operating room where the patient underwent PLIF L5-s1. The patient tolerated the procedure well and was taken to the recovery room and then to the floor in stable condition. The hospital course was routine. There were no complications. The wound remained clean dry and intact. Pt had appropriate back soreness. No complaints of leg pain or new N/T/W. The patient remained afebrile with stable vital signs, and tolerated a regular diet. The patient continued to increase activities, and pain was well controlled with oral pain medications.   Consults: None  Significant Diagnostic Studies:  Results for orders placed or performed during the hospital encounter of 04/28/24  Glucose, capillary   Collection Time: 04/28/24  9:36 AM  Result Value Ref Range   Glucose-Capillary 115 (H) 70 - 99 mg/dL  Type and screen [facility]   Collection Time: 04/28/24 10:42 AM  Result Value Ref Range   ABO/RH(D) O POS    Antibody Screen NEG    Sample Expiration      05/01/2024,2359 Performed at [facility], [address] [place], [place] [address]   Glucose, capillary   Collection Time: 04/28/24 11:30 AM  Result Value Ref Range   Glucose-Capillary 97 70 - 99 mg/dL   Comment 1 Notify RN   Glucose, capillary   Collection Time: 04/28/24  3:17 PM  Result Value Ref Range   Glucose-Capillary 135 (H) 70 - 99 mg/dL   Comment 1 Notify RN   Glucose, capillary   Collection Time: 04/28/24  5:07 PM  Result Value Ref Range   Glucose-Capillary 164 (H) 70 - 99 mg/dL  Glucose, capillary   Collection Time: 04/28/24  9:03 PM  Result Value Ref  Range   Glucose-Capillary 457 (H) 70 - 99 mg/dL   Comment 1 Notify RN    Comment 2 Document in Chart   Glucose, capillary   Collection Time: 04/29/24  6:27 AM  Result Value Ref Range   Glucose-Capillary 209 (H) 70 - 99 mg/dL   Comment 1 Notify RN    Comment 2 Document in Chart     DG Lumbar Spine 2-3 Views Result Date: 04/28/2024 CLINICAL DATA:  Elective surgery EXAM: LUMBAR SPINE - 2-3 VIEW COMPARISON:  Chest x-ray 10/23/2023 FINDINGS: Two intraoperative fluoroscopic views of the lumbar spine. Posterior fusion hardware is present at 4 levels. Please see operative report for further description. Fluoroscopy time 39 seconds. Fluoroscopy dose 54.83 micro Gy. IMPRESSION: Intraoperative fluoroscopic views of the lumbar spine. Electronically Signed   By: Tyron Gallon M.D.   On: 04/28/2024 18:37   DG C-Arm 1-60 Min-No Report Result Date: 04/28/2024 Fluoroscopy was utilized by the requesting physician.  No radiographic interpretation.   DG C-Arm 1-60 Min-No Report Result Date: 04/28/2024 Fluoroscopy was utilized by the requesting physician.  No radiographic interpretation.    Antibiotics:  Anti-infectives (From admission, onward)    Start     Dose/Rate Route Frequency Ordered Stop   04/28/24 1630  ceFAZolin  (ANCEF ) IVPB 2g/100 mL premix        2 g 200 mL/hr over 30 Minutes Intravenous Every 8 hours 04/28/24 1629 04/29/24 0749   04/28/24 1227  vancomycin  HCl (VANCOREADY) 1500 MG/300ML IVPB  Status:  Discontinued       Note to Pharmacy: Linard Reno: cabinet override      04/28/24 1227 04/28/24 1535   04/28/24 1000  ceFAZolin  (ANCEF ) IVPB 2g/100 mL premix        2 g 200 mL/hr over 30 Minutes Intravenous On call to O.R. 04/28/24 0956 04/28/24 1305       Discharge Exam: Blood pressure (!) 105/53, pulse (!) 55, temperature 98.2 F (36.8 C), temperature source Oral, resp. rate 18, height 5\' 4"  (1.626 m), weight 106.6 kg, SpO2 95%. Neurologic: Grossly normal Dressing  dry  Discharge Medications:   Allergies as of 04/29/2024       Reactions   Amoxicillin Itching, Rash   unknown   Statins Nausea And Vomiting, Rash, Other (See Comments)   MYALGIAS   Cephalexin Nausea Only, Rash        Medication List     STOP taking these medications    cyclobenzaprine  10 MG tablet Commonly known as: FLEXERIL    traMADol 50 MG tablet Commonly known as: ULTRAM       TAKE these medications    busPIRone  10 MG tablet Commonly known as: BUSPAR  Take 10 mg by mouth 2 (two) times daily.   celecoxib  200 MG capsule Commonly known as: CELEBREX  Take 200 mg by mouth 2 (two) times daily.   esomeprazole 40 MG capsule Commonly known as: NEXIUM Take 40 mg by mouth daily before breakfast.   furosemide  20 MG tablet Commonly known as: LASIX  Take 20 mg by mouth 2 (two) times daily.   gabapentin  600 MG tablet Commonly known as: NEURONTIN  Take 600 mg by mouth 3 (three) times daily.   HYDROcodone -acetaminophen  10-325 MG tablet Commonly known as: NORCO Take 1-2 tablets by mouth every 6 (six) hours as needed for moderate pain (pain score 4-6).   insulin  glargine 100 UNIT/ML injection Commonly known as: LANTUS Inject 30 Units into the skin at bedtime.   metFORMIN  500 MG 24 hr tablet Commonly known as: GLUCOPHAGE -XR Take 1,000 mg by mouth 2 (two) times daily.   methocarbamol 500 MG tablet Commonly known as: ROBAXIN Take 1 tablet (500 mg total) by mouth every 8 (eight) hours as needed for muscle spasms. What changed:  when to take this reasons to take this   metoprolol tartrate 25 MG tablet Commonly known as: LOPRESSOR Take 25 mg by mouth 2 (two) times daily.   montelukast 10 MG tablet Commonly known as: SINGULAIR Take 10 mg by mouth daily.   PARoxetine 30 MG tablet Commonly known as: PAXIL Take 30 mg by mouth every morning.   pioglitazone 30 MG tablet Commonly known as: ACTOS Take 1 tablet by mouth daily.   rOPINIRole 0.5 MG tablet Commonly  known as: REQUIP Take 1.5 mg by mouth at bedtime.   traZODone 100 MG tablet Commonly known as: DESYREL Take 100 mg by mouth at bedtime as needed for sleep.               Durable Medical Equipment  (From admission, onward)           Start     Ordered   04/28/24 1630  DME Walker rolling  Once       Question:  Patient needs a walker to treat with the following condition  Answer:  S/P lumbar fusion   04/28/24 1629   04/28/24 1630  DME 3 n 1  Once        04/28/24 1629  Disposition: home   Final Dx: PLIF L5-S1  Discharge Instructions      Remove dressing in 72 hours   Complete by: As directed    Call MD for:  difficulty breathing, headache or visual disturbances   Complete by: As directed    Call MD for:  persistant nausea and vomiting   Complete by: As directed    Call MD for:  redness, tenderness, or signs of infection (pain, swelling, redness, odor or green/yellow discharge around incision site)   Complete by: As directed    Call MD for:  severe uncontrolled pain   Complete by: As directed    Call MD for:  temperature >100.4   Complete by: As directed    Diet - low sodium heart healthy   Complete by: As directed    Increase activity slowly   Complete by: As directed           Signed: Isadora Mar 04/29/2024, 7:49 AM

## 2024-05-28 NOTE — Progress Notes (Signed)
 [facility] Population [facility] post TCM follow up  Date of call: 05/28/24  Discharged from: [facility]   Updates/Changes since last encounter: Appt with [facility] 05/14/24   Current Questions/Concerns: Contact made with the patient. She reports that she is sore from surgery but healing well. Reports she has 1 more follow up with her surgeon before she is released. The patient denied any new symptoms at this time. The patient denied any questions or concerns for PCP at this time. Patient verified that she has Hn's contact number and HN encouraged the patient to contact PCP office or HN if questions or concerns arise.   Is patient candidate for Navigation: No - Health navigator will close TCM outreach at this time as 30 day outreach has been completed and the patient does not qualify for chronic outreach services.   Powell Hug RN, BSN, CCM Health Navigator  [facility] [phone]       Electronically signed by: Powell Earnie Hug, RN 05/28/2024 1:28 PM

## 2024-09-30 ENCOUNTER — Other Ambulatory Visit (HOSPITAL_BASED_OUTPATIENT_CLINIC_OR_DEPARTMENT_OTHER): Payer: Self-pay | Admitting: Neurological Surgery

## 2024-09-30 DIAGNOSIS — M5416 Radiculopathy, lumbar region: Secondary | ICD-10-CM

## 2024-10-05 ENCOUNTER — Ambulatory Visit (HOSPITAL_BASED_OUTPATIENT_CLINIC_OR_DEPARTMENT_OTHER)
Admission: RE | Admit: 2024-10-05 | Discharge: 2024-10-05 | Disposition: A | Discharge status: Home / Self Care | Source: Ambulatory Visit | Attending: Neurological Surgery | Admitting: Neurological Surgery

## 2024-10-05 DIAGNOSIS — M5416 Radiculopathy, lumbar region: Secondary | ICD-10-CM | POA: Diagnosis present

## 2024-10-27 ENCOUNTER — Other Ambulatory Visit: Payer: Self-pay | Admitting: Neurological Surgery

## 2024-11-17 NOTE — Progress Notes (Signed)
 Surgical Instructions   Your procedure is scheduled on Wednesday November 24, 2024. Report to [facility] Main Entrance A at 10:15 A.M., then check in with the Admitting office. Any questions or running late day of surgery: call [phone]  Questions prior to your surgery date: call [phone], Monday-Friday, 8am-4pm. If you experience any cold or flu symptoms such as cough, fever, chills, shortness of breath, etc. between now and your scheduled surgery, please notify us  at the above number.     Remember:  Do not eat or drink after midnight the night before your surgery  Take these medicines the morning of surgery with A SIP OF WATER  busPIRone  (BUSPAR )  esomeprazole (NEXIUM)  gabapentin  (NEURONTIN )  metoprolol  tartrate (LOPRESSOR )  montelukast  (SINGULAIR )  PARoxetine  (PAXIL )    May take these medicines IF NEEDED: HYDROcodone -acetaminophen  (NORCO)  methocarbamol  (ROBAXIN )   One week prior to surgery, STOP taking any Aspirin (unless otherwise instructed by your surgeon) Aleve, Naproxen, Ibuprofen, Motrin, Advil, Goody's, BC's, all herbal medications, fish oil, and non-prescription vitamins. This includes your celecoxib  (CELEBREX )    WHAT DO I DO ABOUT MY DIABETES MEDICATION?   Do not take oral diabetes medicines (pills) the morning of surgery.        DO NOT TAKE YOUR metFORMIN  (GLUCOPHAGE -XR) OR pioglitazone  (ACTOS )  THE MORNING OF SURGERY.    THE NIGHT BEFORE SURGERY TAKE 15 UNITS OF YOUR insulin  glargine (LANTUS ), WHICH IS 50% OF YOUR REGULAR DOSE.   The day of surgery, do not take other diabetes injectables, including Byetta (exenatide), Bydureon (exenatide ER), Victoza  (liraglutide ), or Trulicity (dulaglutide).  If your CBG is greater than 220 mg/dL, you may take  of your sliding scale (correction) dose of insulin .   HOW TO MANAGE YOUR DIABETES BEFORE AND AFTER SURGERY  Why is it important to control my blood sugar before and after surgery? Improving blood  sugar levels before and after surgery helps healing and can limit problems. A way of improving blood sugar control is eating a healthy diet by:  Eating less sugar and carbohydrates  Increasing activity/exercise  Talking with your doctor about reaching your blood sugar goals High blood sugars (greater than 180 mg/dL) can raise your risk of infections and slow your recovery, so you will need to focus on controlling your diabetes during the weeks before surgery. Make sure that the doctor who takes care of your diabetes knows about your planned surgery including the date and location.  How do I manage my blood sugar before surgery? Check your blood sugar at least 4 times a day, starting 2 days before surgery, to make sure that the level is not too high or low.  Check your blood sugar the morning of your surgery when you wake up and every 2 hours until you get to the Short Stay unit.  If your blood sugar is less than 70 mg/dL, you will need to treat for low blood sugar: Do not take insulin . Treat a low blood sugar (less than 70 mg/dL) with  cup of clear juice (cranberry or apple), 4 glucose tablets, OR glucose gel. Recheck blood sugar in 15 minutes after treatment (to make sure it is greater than 70 mg/dL). If your blood sugar is not greater than 70 mg/dL on recheck, call [phone] for further instructions. Report your blood sugar to the short stay nurse when you get to Short Stay.  If you are admitted to the hospital after surgery: Your blood sugar will be checked by the staff and you will  probably be given insulin  after surgery (instead of oral diabetes medicines) to make sure you have good blood sugar levels. The goal for blood sugar control after surgery is 80-180 mg/dL.                      Do NOT Smoke (Tobacco/Vaping) for 24 hours prior to your procedure.  If you use a CPAP at night, you may bring your mask/headgear for your overnight stay.   You will be asked to remove any  contacts, glasses, piercing's, hearing aid's, dentures/partials prior to surgery. Please bring cases for these items if needed.    Patients discharged the day of surgery will not be allowed to drive home, and someone needs to stay with them for 24 hours.  SURGICAL WAITING ROOM VISITATION Patients may have no more than 2 support people in the waiting area - these visitors may rotate.   Pre-op nurse will coordinate an appropriate time for 1 ADULT support person, who may not rotate, to accompany patient in pre-op.  Children under the age of 79 must have an adult with them who is not the patient and must remain in the main waiting area with an adult.  If the patient needs to stay at the hospital during part of their recovery, the visitor guidelines for inpatient rooms apply.  Please refer to the [facility] website for the visitor guidelines for any additional information.   If you received a COVID test during your pre-op visit  it is requested that you wear a mask when out in public, stay away from anyone that may not be feeling well and notify your surgeon if you develop symptoms. If you have been in contact with anyone that has tested positive in the last 10 days please notify you surgeon.      Pre-operative 4 CHG Bathing Instructions   You can play a key role in reducing the risk of infection after surgery. Your skin needs to be as free of germs as possible. You can reduce the number of germs on your skin by washing with CHG (chlorhexidine  gluconate) soap before surgery. CHG is an antiseptic soap that kills germs and continues to kill germs even after washing.   DO NOT use if you have an allergy to chlorhexidine /CHG or antibacterial soaps. If your skin becomes reddened or irritated, stop using the CHG and notify one of our RNs at [phone].   Please shower with the CHG soap starting 4 days before surgery using the following schedule:     Please keep in mind the following:  DO NOT  shave, including legs and underarms, starting the day of your first shower.   Place clean sheets on your bed the day you start using CHG soap. Use a clean washcloth (not used since being washed) for each shower. DO NOT sleep with pets once you start using the CHG.   CHG Shower Instructions:  Wash your face and private area with normal soap. If you choose to wash your hair, wash first with your normal shampoo.  After you use shampoo/soap, rinse your hair and body thoroughly to remove shampoo/soap residue.  Turn the water OFF and apply  bottle of CHG soap to a CLEAN washcloth.  Apply CHG soap ONLY FROM YOUR NECK DOWN TO YOUR TOES (washing for 3-5 minutes)  DO NOT use CHG soap on face, private areas, open wounds, or sores.  Pay special attention to the area where your surgery is being performed.  If you are having back surgery, having someone wash your back for you may be helpful. Wait 2 minutes after CHG soap is applied, then you may rinse off the CHG soap.  Pat dry with a clean towel  Put on clean clothes/pajamas   If you choose to wear lotion, please use ONLY the CHG-compatible lotions that are listed below.  Additional instructions for the day of surgery:  If you choose, you may shower the morning of surgery with an antibacterial soap.  DO NOT APPLY any lotions, deodorants or perfumes.   Do not bring valuables to the hospital. [facility] is not responsible for any belongings/valuables. Do not wear nail polish, gel polish, artificial nails, or any other type of covering on natural nails (fingers and toes) Do not wear jewelry or makeup Put on clean/comfortable clothes.  Please brush your teeth.  Ask your nurse before applying any prescription medications to the skin.     CHG Compatible Lotions   Aveeno Moisturizing lotion  Cetaphil Moisturizing Cream  Cetaphil Moisturizing Lotion  Clairol Herbal Essence Moisturizing Lotion, Dry Skin  Clairol Herbal Essence Moisturizing Lotion,  Extra Dry Skin  Clairol Herbal Essence Moisturizing Lotion, Normal Skin  Curel Age Defying Therapeutic Moisturizing Lotion with Alpha Hydroxy  Curel Extreme Care Body Lotion  Curel Soothing Hands Moisturizing Hand Lotion  Curel Therapeutic Moisturizing Cream, Fragrance-Free  Curel Therapeutic Moisturizing Lotion, Fragrance-Free  Curel Therapeutic Moisturizing Lotion, Original Formula  Eucerin Daily Replenishing Lotion  Eucerin Dry Skin Therapy Plus Alpha Hydroxy Crme  Eucerin Dry Skin Therapy Plus Alpha Hydroxy Lotion  Eucerin Original Crme  Eucerin Original Lotion  Eucerin Plus Crme Eucerin Plus Lotion  Eucerin TriLipid Replenishing Lotion  Keri Anti-Bacterial Hand Lotion  Keri Deep Conditioning Original Lotion Dry Skin Formula Softly Scented  Keri Deep Conditioning Original Lotion, Fragrance Free Sensitive Skin Formula  Keri Lotion Fast Absorbing Fragrance Free Sensitive Skin Formula  Keri Lotion Fast Absorbing Softly Scented Dry Skin Formula  Keri Original Lotion  Keri Skin Renewal Lotion Keri Silky Smooth Lotion  Keri Silky Smooth Sensitive Skin Lotion  Nivea Body Creamy Conditioning Oil  Nivea Body Extra Enriched Lotion  Nivea Body Original Lotion  Nivea Body Sheer Moisturizing Lotion Nivea Crme  Nivea Skin Firming Lotion  NutraDerm 30 Skin Lotion  NutraDerm Skin Lotion  NutraDerm Therapeutic Skin Cream  NutraDerm Therapeutic Skin Lotion  ProShield Protective Hand Cream  Provon moisturizing lotion  Please read over the following fact sheets that you were given.

## 2024-11-18 ENCOUNTER — Encounter (HOSPITAL_COMMUNITY)
Admission: RE | Admit: 2024-11-18 | Discharge: 2024-11-18 | Disposition: A | Discharge status: Home / Self Care | Source: Ambulatory Visit | Attending: Neurological Surgery | Admitting: Neurological Surgery

## 2024-11-18 ENCOUNTER — Encounter (HOSPITAL_COMMUNITY): Payer: Self-pay

## 2024-11-18 ENCOUNTER — Other Ambulatory Visit: Payer: Self-pay

## 2024-11-18 VITALS — BP 111/60 | HR 85 | Temp 98.4°F | Resp 22 | Ht 64.0 in | Wt 251.3 lb

## 2024-11-18 DIAGNOSIS — Z01818 Encounter for other preprocedural examination: Secondary | ICD-10-CM

## 2024-11-18 LAB — HEMOGLOBIN A1C
Hgb A1c MFr Bld: 7.3 % — ABNORMAL HIGH (ref 4.8–5.6)
Mean Plasma Glucose: 162.81 mg/dL

## 2024-11-18 LAB — BASIC METABOLIC PANEL WITH GFR
Anion gap: 12 (ref 5–15)
BUN: 16 mg/dL (ref 8–23)
CO2: 28 mmol/L (ref 22–32)
Calcium: 9.1 mg/dL (ref 8.9–10.3)
Chloride: 99 mmol/L (ref 98–111)
Creatinine, Ser: 1.59 mg/dL — ABNORMAL HIGH (ref 0.44–1.00)
GFR, Estimated: 36 mL/min — ABNORMAL LOW (ref >60)
Glucose, Bld: 151 mg/dL — ABNORMAL HIGH (ref 70–99)
Potassium: 4.9 mmol/L (ref 3.5–5.1)
Sodium: 139 mmol/L (ref 135–145)

## 2024-11-18 LAB — CBC
HCT: 36.2 % (ref 36.0–46.0)
Hemoglobin: 11.5 g/dL — ABNORMAL LOW (ref 12.0–15.0)
MCH: 31 pg (ref 26.0–34.0)
MCHC: 31.8 g/dL (ref 30.0–36.0)
MCV: 97.6 fL (ref 80.0–100.0)
Platelets: 167 K/uL (ref 150–400)
RBC: 3.71 MIL/uL — ABNORMAL LOW (ref 3.87–5.11)
RDW: 14.6 % (ref 11.5–15.5)
WBC: 11.1 K/uL — ABNORMAL HIGH (ref 4.0–10.5)
nRBC: 0 % (ref 0.0–0.2)

## 2024-11-18 LAB — TYPE AND SCREEN
ABO/RH(D): O POS
Antibody Screen: NEGATIVE

## 2024-11-18 LAB — SURGICAL PCR SCREEN
MRSA, PCR: NEGATIVE
Staphylococcus aureus: NEGATIVE

## 2024-11-18 LAB — GLUCOSE, CAPILLARY: Glucose-Capillary: 163 mg/dL — ABNORMAL HIGH (ref 70–99)

## 2024-11-18 NOTE — Progress Notes (Signed)
 PCP - Lauraine Lent Cardiologist - Dr. Malvin Ruse - last seen 08/06/23 - patient states she was only seeing for cholesterol injection and she can no longer afford   PPM/ICD - denies Device Orders - n/a Rep Notified -  n/a  Chest x-ray - 03/23/24 EKG - 04/28/24 Stress Test - 2022 ECHO - 12/04/22 Cardiac Cath - denies  Sleep Study - OSA+ but does not wear a CPAP  Fasting Blood Sugar - 123 Checks Blood Sugar every other day  Last dose of GLP1 agonist-  n/a GLP1 instructions:  n/a  Blood Thinner Instructions:  n/a Aspirin Instructions: n/a  ERAS Protcol -  NPO PRE-SURGERY Ensure or G2-  n/a  COVID TEST- n/a   Anesthesia review: yes - cardiac history, patient denies any recent respiratory issues and denies chest pain  Patient denies shortness of breath, fever, cough and chest pain at PAT appointment   All instructions explained to the patient, with a verbal understanding of the material. Patient agrees to go over the instructions while at home for a better understanding. Patient also instructed to self quarantine after being tested for COVID-19. The opportunity to ask questions was provided.

## 2024-11-19 NOTE — Progress Notes (Signed)
 Anesthesia Chart Review:  Case: [id number] Date/Time: 11/24/24 1200   Procedure: LAMINECTOMY WITH POSTERIOR LATERAL ARTHRODESIS LEVEL 1 (Back) - Lumbar re-exploration with re-do posterolateral fusion L5-S1 and revision of hardware   Anesthesia type: General   Diagnosis: Lumbar pseudoarthrosis [S32.009K]   Pre-op diagnosis: Lumbar pseudoarthrosis   [facility]OM [facility] / [facility]   Surgeons: Joshua Alm Hamilton, MD       DISCUSSION:  Patient is a 68 year old female scheduled for the above procedure. S/p L5-S1 PLIF on 04/28/2024. At 10/26/2024 neurosurgery follow-up was still having RLE and back pain, progressive and worse with activity.  10/05/2024 CT showed pedicle screw fixation at L5-S1 with interbody Spacer and mild lucency along the L5 pedicle screws which could reflect early loosening. Above surgery now scheduled.    History includes never smoker, postoperative N/V, DM2, OSA (does not use CPAP), GERD, fibromyalgia, iron deficiency, cholecystectomy, hysterectomy, appendectomy, spinal surgery (Right L3-4 extraforaminal microdiscectomy 12/01/15; L3-4 PLIF 03/13/16; L4-5 PLIF 06/12/17; L5-S1 PLIF, exploration L4-5 with removal of non-segmental instrumentation 04/28/2024). BMI is consistent with morbid obesity.    She had bronchoscopy on 12/24/2022 by pulmonologist Karena Crank, DO at [facility] for possible tracheal mass. She underwent right interscalene block for right rotator cuff repair on 12/04/22. She presented to ED on 12/07/22 with acute SOB with home O2 sats in the 80s. She had been taking Percocet for pain intermittently with some associated drowsiness. She required BiPAP initially but then transitioned to 2L O2/[place]. CTA chest was negative for PE, but showed findings consistent with bronchitis and upper airway trapping, increased elevation of the right hemidiaphgram, and 1.4 x 0.8 x 1.9 cm, solid lesion or adherent material along the left posterolateral tracheal wall which narrowed the tracheal  up to 50%. She was weaned from supplemental O2 by POD#1. 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 She had out-patient bronchoscopy on 12/25/22 with normal findings, ETT retracted to the level of the vocal cords and there was no evidence of tumor or mucosal abnormality within the visualized trachea.  No specimens collected. Repeat chest CT on 01/05/23 showed  improving patchy groundglass opacities, resolution of retained tracheal secretions, no mass, with mild residual narrowing of the trachea by anterior displacement of the esophagus due to deviously noted aberrant right subclavian artery.   She has had cardiology evaluation by Dr. Terryann for dyslipidemia and exertional dyspnea. 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  She was intolerant to statins, requiring hospitalization for myositis/rhabdomyolysis.  She was started on Zetia as well as Repatha with LDL goal of less than 100. She was unable to afford Rapatha.  She is not longer on Zetia either. Dyspnea workup included echocardiogram which showed preserved LV function with septal basal hypertrophy and negative nuclear stress test in 2022.  At least visit on 08/02/23, she denied chest pain, orthopnea, PND, and syncope. Breathing stable. No specific follow-up recommendations noted---patient reported as needed basis as she was unable to continue with Repatha injections due to cost.    A1c 7.3%. She is on Lantus  30 units Q HS, metformin  1000 mg BID, Actos  30 mg daily.    Anesthesia team to evaluate on the day of surgery.   VS: BP 111/60   Pulse 85   Temp 36.9 C (Oral)   Resp (!) 22   Ht 5' 4 (1.626 m)   Wt 114 kg   SpO2 95%   BMI  43.14 kg/m    PROVIDERS: Marshall Lowers, MD is PCP  Terryann Fredericks, MD is cardiologist    LABS: Labs reviewed: Acceptable for  surgery. Cr 1.59, previously 1.42 on 04/01/2024 and 1.32 on 09/14/2024 ([facility]). TSH 1.882 on 09/14/2024.  (all labs ordered are listed, but only abnormal results are displayed)  Labs Reviewed  GLUCOSE, CAPILLARY - Abnormal; Notable for the following components:      Result Value   Glucose-Capillary 163 (*)    All other components within normal limits  HEMOGLOBIN A1C - Abnormal; Notable for the following components:   Hgb A1c MFr Bld 7.3 (*)    All other components within normal limits  CBC - Abnormal; Notable for the following components:   WBC 11.1 (*)    RBC 3.71 (*)    Hemoglobin 11.5 (*)    All other components within normal limits  BASIC METABOLIC PANEL WITH GFR - Abnormal; Notable for the following components:   Glucose, Bld 151 (*)    Creatinine, Ser 1.59 (*)    GFR, Estimated 36 (*)    All other components within normal limits  SURGICAL PCR SCREEN  TYPE AND SCREEN     IMAGES: CT L-spine 10/05/2024: IMPRESSION: 1. Posterolateral rod and pedicle screw fixation at L5-S1 with interbody spacer; mild lucency along the L5 pedicle screws may reflect early loosening. 2. Pedicle screws not attached to posterolateral rods bilaterally at L3-4 and L4-5 with interbody spacers present at both levels. 3. Moderate left foraminal stenosis at L2-3 due to disc osteophyte complex and facet arthropathy, mildly worsened from 11/25/23. 4. Moderate right foraminal stenosis at L3-4 due to intervertebral and facet arthropathy, roughly similar to 11/25/23. 5. Mild grade 1 anterolisthesis at L5-S1 with moderate right foraminal stenosis due to intervertebral spurring.   CXR 03/23/2024 ([facility]):  FINDINGS: - Cardiomediastinal silhouette unchanged in size and contour. No evidence of central vascular congestion. No interlobular septal thickening. - Improved aeration compared to the prior. - No pneumothorax or pleural effusion. Coarsened interstitial markings, with no confluent airspace  disease. - No acute displaced fracture. Degenerative changes of the spine. IMPRESSION: Negative for acute cardiopulmonary disease        EKG:  Sinus bradycardia at 54 bpm Possible Anterior infarct , age undetermined  Abnormal ECG  When compared with ECG of 09-Jun-2017 08:15, PREVIOUS ECG IS PRESENT Since last tracing rate slower Confirmed by Levern Hutching ([id number]) on 04/28/2024 12:49:28 PM    CV: Nuclear stress test 09/10/21 ([facility]): IMPRESSION  1.  No evidence of fixed or inducible ischemia on the study.  There was  noted on the stress images some patient motion and there was also on the  rest images gut uptake adjacent to the inferior septal wall.  Fortunately  these did not influence imaging.  2.  Calculated ejection fraction is hyperdynamic at 85%.  3.  No evidence of convincing reversible or fixed ischemia on the study.   The ejection fraction is hyperkinetic.     Echo 07/30/21 ([facility]): SUMMARY  The left ventricular size is normal. There is normal left ventricular  wall thickness. Basal left ventricular septal hypertrophy The left  ventricular wall motion is normal. Left ventricular systolic function  is normal.LV ejection fraction = 60-65%.  Left ventricular filling pattern is indeterminate. Left atrial  pressure is indeterminate due to 'E/e' > 8 and < 14'  The right ventricle is normal in size and function.  The left atrial size is normal.  There is no significant  valvular stenosis or regurgitation.  The IVC is normal in size with an inspiratory collapse of greater then  50%, suggesting normal right atrial pressure.  There was insufficient TR detected to calculate RV systolic pressure.   Past Medical History:  Diagnosis Date   Anxiety    Arthritis    OA,- hands (RA)& back     Asthma    Atherosclerosis    Constipation    Diabetes mellitus without complication (HCC)    type 2   DOE (dyspnea on exertion)    Fibromyalgia    takes cymbalta  and gabapentin     GERD (gastroesophageal reflux disease)    takes nexium   Headache    headaches and migraines   Hypertension    well-controlled   Low iron    hx of - no meds now   PONV (postoperative nausea and vomiting)    Sleep apnea    does not use CPAP    Past Surgical History:  Procedure Laterality Date   ABDOMINAL HYSTERECTOMY     APPENDECTOMY     BACK SURGERY     lumbar decompression    BILATERAL CARPAL TUNNEL RELEASE     BLADDER SUSPENSION     CHOLECYSTECTOMY     COLONOSCOPY     KNEE ARTHROPLASTY Left    LUMBAR LAMINECTOMY/DECOMPRESSION MICRODISCECTOMY Right 12/01/2015   Procedure:  extraforaminal Microdiscectomy  - Right Lumbar three-lumbar four ;  Surgeon: Alm GORMAN Molt, MD;  Location: [facility];  Service: Neurosurgery;  Laterality: Right;   POSTERIOR LUMBAR FUSION  04/28/2024   SHOULDER SURGERY Right    TRANSFORAMINAL LUMBAR INTERBODY FUSION (TLIF) WITH PEDICLE SCREW FIXATION 1 LEVEL Right 03/13/2016   Procedure: Right LUMBAR THREE- FOUR TRANSFORAMINAL LUMBAR INTERBODY FUSION (TLIF) WITH PEDICLE SCREW FIXATION ;  Surgeon: Alm GORMAN Molt, MD;  Location: [facility];  Service: Neurosurgery;  Laterality: Right;   TRIGGER FINGER RELEASE Right    index finger    MEDICATIONS:  albuterol  (VENTOLIN  HFA) 108 (90 Base) MCG/ACT inhaler   busPIRone  (BUSPAR ) 10 MG tablet   celecoxib  (CELEBREX ) 200 MG capsule   dicyclomine (BENTYL) 10 MG capsule   esomeprazole (NEXIUM) 40 MG capsule   furosemide  (LASIX ) 20 MG tablet   gabapentin  (NEURONTIN ) 600 MG tablet   HYDROcodone -acetaminophen  (NORCO) 10-325 MG tablet   insulin  glargine (LANTUS ) 100 UNIT/ML injection   ketoconazole (NIZORAL) 2 % shampoo   metFORMIN  (GLUCOPHAGE -XR) 500 MG 24 hr tablet   methocarbamol  (ROBAXIN ) 500 MG tablet   metoprolol  tartrate (LOPRESSOR ) 25 MG tablet   montelukast  (SINGULAIR ) 10 MG tablet   PARoxetine  (PAXIL ) 30 MG tablet   pioglitazone  (ACTOS ) 30 MG tablet   rOPINIRole  (REQUIP ) 0.5 MG tablet   traZODone   (DESYREL ) 100 MG tablet   triamcinolone cream (KENALOG) 0.1 %   No current facility-administered medications for this encounter.    Isaiah Ruder, PA-C Surgical Short Stay/Anesthesiology [facility] Phone [phone] [facility] Phone [phone] 11/19/2024 3:27 PM

## 2024-11-19 NOTE — Anesthesia Preprocedure Evaluation (Addendum)
 Anesthesia Evaluation  Patient identified by MRN, date of birth, ID band Patient awake    Reviewed: Allergy & Precautions, H&P , NPO status , Patient's Chart, lab work & pertinent test results, reviewed documented beta blocker date and time   History of Anesthesia Complications (+) PONV and history of anesthetic complications  Airway Mallampati: II  TM Distance: >3 FB Neck ROM: Full    Dental no notable dental hx. (+) Teeth Intact, Dental Advisory Given   Pulmonary asthma , sleep apnea and Continuous Positive Airway Pressure Ventilation    breath sounds clear to auscultation + decreased breath sounds      Cardiovascular hypertension, Pt. on medications and Pt. on home beta blockers + DOE   Rhythm:Regular Rate:Normal     Neuro/Psych  Headaches  Anxiety      Neuromuscular disease    GI/Hepatic Neg liver ROS,GERD  Medicated,,  Endo/Other  diabetes, Well Controlled, Type 2, Oral Hypoglycemic Agents, Insulin  Dependent  Class 3 obesityHLD  Renal/GU Renal InsufficiencyRenal diseasenegative Renal ROSLab Results      Component                Value               Date                      NA                       139                 11/18/2024                CL                       99                  11/18/2024                K                        4.9                 11/18/2024                CO2                      28                  11/18/2024                BUN                      16                  11/18/2024                CREATININE               1.59 (H)            11/18/2024                GFRNONAA                 36 (L)              11/18/2024  CALCIUM                  9.1                 11/18/2024                GLUCOSE                  151 (H)             11/18/2024             negative genitourinary   Musculoskeletal  (+) Arthritis , Osteoarthritis,  Fibromyalgia -Lumbar pseudoarthrosis    Abdominal  (+) + obese  Peds  Hematology negative hematology ROS (+) Blood dyscrasia, anemia Lab Results      Component                Value               Date                      WBC                      11.1 (H)            11/18/2024                HGB                      11.5 (L)            11/18/2024                HCT                      36.2                11/18/2024                MCV                      97.6                11/18/2024                PLT                      167                 11/18/2024              Anesthesia Other Findings   Reproductive/Obstetrics negative OB ROS                              Anesthesia Physical Anesthesia Plan  ASA: 3  Anesthesia Plan: General   Post-op Pain Management: Ofirmev  IV (intra-op)*   Induction: Intravenous  PONV Risk Score and Plan: 4 or greater and Ondansetron , Dexamethasone , Propofol  infusion and TIVA  Airway Management Planned: Oral ETT  Additional Equipment: None  Intra-op Plan:   Post-operative Plan: Extubation in OR  Informed Consent: I have reviewed the patients History and Physical, chart, labs and discussed the procedure including the risks, benefits and alternatives for the proposed anesthesia with the patient or authorized representative who has indicated his/her understanding and acceptance.     Dental advisory given  Plan Discussed with:  CRNA and Anesthesiologist  Anesthesia Plan Comments: (PAT note written 11/19/2024 by Isaiah Ruder, PA-C.  )        Anesthesia Quick Evaluation

## 2024-11-24 ENCOUNTER — Inpatient Hospital Stay (HOSPITAL_COMMUNITY): Admitting: Anesthesiology

## 2024-11-24 ENCOUNTER — Inpatient Hospital Stay (HOSPITAL_COMMUNITY)

## 2024-11-24 ENCOUNTER — Inpatient Hospital Stay (HOSPITAL_COMMUNITY)
Admission: RE | Admit: 2024-11-24 | Discharge: 2024-11-25 | DRG: 451 | Disposition: A | Attending: Neurological Surgery | Admitting: Neurological Surgery

## 2024-11-24 ENCOUNTER — Encounter: Admission: RE | Disposition: A | Payer: Self-pay | Attending: Neurological Surgery

## 2024-11-24 ENCOUNTER — Encounter (HOSPITAL_COMMUNITY): Payer: Self-pay | Admitting: Neurological Surgery

## 2024-11-24 ENCOUNTER — Inpatient Hospital Stay (HOSPITAL_COMMUNITY): Payer: Self-pay | Admitting: Physician Assistant

## 2024-11-24 DIAGNOSIS — K219 Gastro-esophageal reflux disease without esophagitis: Secondary | ICD-10-CM | POA: Diagnosis present

## 2024-11-24 DIAGNOSIS — Z96652 Presence of left artificial knee joint: Secondary | ICD-10-CM | POA: Diagnosis present

## 2024-11-24 DIAGNOSIS — Z6841 Body Mass Index (BMI) 40.0 and over, adult: Secondary | ICD-10-CM | POA: Diagnosis not present

## 2024-11-24 DIAGNOSIS — I10 Essential (primary) hypertension: Secondary | ICD-10-CM | POA: Diagnosis present

## 2024-11-24 DIAGNOSIS — T84038A Mechanical loosening of other internal prosthetic joint, initial encounter: Secondary | ICD-10-CM

## 2024-11-24 DIAGNOSIS — G4730 Sleep apnea, unspecified: Secondary | ICD-10-CM | POA: Diagnosis present

## 2024-11-24 DIAGNOSIS — E119 Type 2 diabetes mellitus without complications: Secondary | ICD-10-CM | POA: Diagnosis present

## 2024-11-24 DIAGNOSIS — F419 Anxiety disorder, unspecified: Secondary | ICD-10-CM | POA: Diagnosis present

## 2024-11-24 DIAGNOSIS — M797 Fibromyalgia: Secondary | ICD-10-CM | POA: Diagnosis present

## 2024-11-24 DIAGNOSIS — Z7984 Long term (current) use of oral hypoglycemic drugs: Secondary | ICD-10-CM

## 2024-11-24 DIAGNOSIS — Z96698 Presence of other orthopedic joint implants: Secondary | ICD-10-CM | POA: Diagnosis not present

## 2024-11-24 DIAGNOSIS — Z981 Arthrodesis status: Principal | ICD-10-CM

## 2024-11-24 DIAGNOSIS — Z8249 Family history of ischemic heart disease and other diseases of the circulatory system: Secondary | ICD-10-CM | POA: Diagnosis not present

## 2024-11-24 DIAGNOSIS — M960 Pseudarthrosis after fusion or arthrodesis: Secondary | ICD-10-CM | POA: Diagnosis present

## 2024-11-24 DIAGNOSIS — Y848 Other medical procedures as the cause of abnormal reaction of the patient, or of later complication, without mention of misadventure at the time of the procedure: Secondary | ICD-10-CM | POA: Diagnosis present

## 2024-11-24 DIAGNOSIS — M5417 Radiculopathy, lumbosacral region: Secondary | ICD-10-CM | POA: Diagnosis present

## 2024-11-24 DIAGNOSIS — Z794 Long term (current) use of insulin: Secondary | ICD-10-CM | POA: Diagnosis not present

## 2024-11-24 DIAGNOSIS — Z79899 Other long term (current) drug therapy: Secondary | ICD-10-CM

## 2024-11-24 DIAGNOSIS — Z01818 Encounter for other preprocedural examination: Secondary | ICD-10-CM

## 2024-11-24 HISTORY — PX: LAMINECTOMY WITH POSTERIOR LATERAL ARTHRODESIS LEVEL 1: SHX6335

## 2024-11-24 LAB — GLUCOSE, CAPILLARY
Glucose-Capillary: 146 mg/dL — ABNORMAL HIGH (ref 70–99)
Glucose-Capillary: 109 mg/dL — ABNORMAL HIGH (ref 70–99)
Glucose-Capillary: 240 mg/dL — ABNORMAL HIGH (ref 70–99)

## 2024-11-24 SURGERY — LAMINECTOMY WITH POSTERIOR LATERAL ARTHRODESIS LEVEL 1
Anesthesia: General | Site: Back

## 2024-11-24 MED ORDER — HYDROMORPHONE HCL 1 MG/ML IJ SOLN: 1.0000 mg | Freq: Once | INTRAMUSCULAR | Status: AC

## 2024-11-24 MED ORDER — CHLORHEXIDINE GLUCONATE CLOTH 2 % EX PADS: 6.0000 | MEDICATED_PAD | Freq: Once | CUTANEOUS | Status: DC

## 2024-11-24 MED ORDER — METFORMIN HCL ER 500 MG PO TB24
1000.0000 mg | ORAL_TABLET | Freq: Two times a day (BID) | ORAL | Status: DC
  Administered 2024-11-25: 06:00:00 1000 mg via ORAL
  Filled 2024-11-24: qty 2

## 2024-11-24 MED ORDER — THROMBIN 5000 UNITS EX KIT
PACK | CUTANEOUS | Status: AC
  Filled 2024-11-24: qty 1

## 2024-11-24 MED ORDER — CHLORHEXIDINE GLUCONATE 0.12 % MT SOLN
15.0000 mL | Freq: Once | OROMUCOSAL | Status: AC
  Administered 2024-11-24: 11:00:00 15 mL via OROMUCOSAL
  Filled 2024-11-24: qty 15

## 2024-11-24 MED ORDER — CEFAZOLIN SODIUM-DEXTROSE 2-4 GM/100ML-% IV SOLN
2.0000 g | INTRAVENOUS | Status: AC
  Administered 2024-11-24: 14:00:00 2 g via INTRAVENOUS
  Filled 2024-11-24: qty 100

## 2024-11-24 MED ORDER — INSULIN ASPART 100 UNIT/ML IJ SOLN
0.0000 [IU] | Freq: Every day | INTRAMUSCULAR | Status: DC
  Administered 2024-11-24: 23:00:00 2 [IU] via SUBCUTANEOUS
  Filled 2024-11-24: qty 2

## 2024-11-24 MED ORDER — ACETAMINOPHEN 325 MG PO TABS
650.0000 mg | ORAL_TABLET | Freq: Once | ORAL | Status: AC
  Administered 2024-11-24: 11:00:00 650 mg via ORAL

## 2024-11-24 MED ORDER — POTASSIUM CHLORIDE IN NACL 20-0.9 MEQ/L-% IV SOLN
INTRAVENOUS | Status: DC
  Filled 2024-11-24: qty 1000

## 2024-11-24 MED ORDER — LIDOCAINE 2% (20 MG/ML) 5 ML SYRINGE
INTRAMUSCULAR | Status: DC | PRN
  Administered 2024-11-24: 13:00:00 60 mg via INTRAVENOUS

## 2024-11-24 MED ORDER — ONDANSETRON HCL 4 MG/2ML IJ SOLN: 4.0000 mg | Freq: Four times a day (QID) | INTRAMUSCULAR | Status: DC | PRN

## 2024-11-24 MED ORDER — 0.9 % SODIUM CHLORIDE (POUR BTL) OPTIME
TOPICAL | Status: DC | PRN
  Administered 2024-11-24: 16:00:00 1000 mL

## 2024-11-24 MED ORDER — ONDANSETRON HCL 4 MG PO TABS
4.0000 mg | ORAL_TABLET | Freq: Four times a day (QID) | ORAL | Status: DC | PRN
  Administered 2024-11-25: 04:00:00 4 mg via ORAL
  Filled 2024-11-24: qty 1

## 2024-11-24 MED ORDER — MENTHOL 3 MG MT LOZG: 1.0000 | LOZENGE | OROMUCOSAL | Status: DC | PRN

## 2024-11-24 MED ORDER — INSULIN ASPART 100 UNIT/ML IJ SOLN
INTRAMUSCULAR | Status: AC
  Filled 2024-11-24: qty 2

## 2024-11-24 MED ORDER — SODIUM CHLORIDE 0.9% FLUSH
3.0000 mL | Freq: Two times a day (BID) | INTRAVENOUS | Status: DC
  Administered 2024-11-24: 22:00:00 3 mL via INTRAVENOUS

## 2024-11-24 MED ORDER — PROPOFOL 10 MG/ML IV BOLUS
INTRAVENOUS | Status: DC | PRN
  Administered 2024-11-24: 13:00:00 150 mg via INTRAVENOUS

## 2024-11-24 MED ORDER — BUPIVACAINE HCL (PF) 0.25 % IJ SOLN
INTRAMUSCULAR | Status: DC | PRN
  Administered 2024-11-24: 16:00:00 30 mL

## 2024-11-24 MED ORDER — KETAMINE HCL 10 MG/ML IJ SOLN
INTRAMUSCULAR | Status: DC | PRN
  Administered 2024-11-24: 14:00:00 30 mg via INTRAVENOUS

## 2024-11-24 MED ORDER — HYDROMORPHONE HCL 1 MG/ML IJ SOLN
0.2500 mg | INTRAMUSCULAR | Status: DC | PRN
  Administered 2024-11-24: 16:00:00 0.5 mg via INTRAVENOUS

## 2024-11-24 MED ORDER — ONDANSETRON HCL 4 MG/2ML IJ SOLN: 4.0000 mg | Freq: Once | INTRAMUSCULAR | Status: DC | PRN

## 2024-11-24 MED ORDER — LACTATED RINGERS IV SOLN: INTRAVENOUS | Status: DC

## 2024-11-24 MED ORDER — INSULIN ASPART 100 UNIT/ML IJ SOLN
0.0000 [IU] | INTRAMUSCULAR | Status: DC | PRN
  Administered 2024-11-24: 11:00:00 2 [IU] via SUBCUTANEOUS

## 2024-11-24 MED ORDER — MIDAZOLAM HCL 2 MG/2ML IJ SOLN
INTRAMUSCULAR | Status: AC
  Filled 2024-11-24: qty 2

## 2024-11-24 MED ORDER — METHOCARBAMOL 500 MG PO TABS
500.0000 mg | ORAL_TABLET | Freq: Three times a day (TID) | ORAL | Status: DC | PRN
  Administered 2024-11-24 – 2024-11-25 (×2): 500 mg via ORAL
  Filled 2024-11-24 (×2): qty 1

## 2024-11-24 MED ORDER — SODIUM CHLORIDE 0.9% FLUSH: 3.0000 mL | INTRAVENOUS | Status: DC | PRN

## 2024-11-24 MED ORDER — OXYCODONE HCL 5 MG/5ML PO SOLN: 5.0000 mg | Freq: Once | ORAL | Status: DC | PRN

## 2024-11-24 MED ORDER — PHENYLEPHRINE 80 MCG/ML (10ML) SYRINGE FOR IV PUSH (FOR BLOOD PRESSURE SUPPORT)
PREFILLED_SYRINGE | INTRAVENOUS | Status: AC
  Filled 2024-11-24: qty 20

## 2024-11-24 MED ORDER — DICYCLOMINE HCL 10 MG PO CAPS
10.0000 mg | ORAL_CAPSULE | Freq: Three times a day (TID) | ORAL | Status: DC
  Administered 2024-11-25: 06:00:00 10 mg via ORAL
  Filled 2024-11-24: qty 1

## 2024-11-24 MED ORDER — ACETAMINOPHEN 500 MG PO TABS
1000.0000 mg | ORAL_TABLET | ORAL | Status: DC
  Filled 2024-11-24: qty 2

## 2024-11-24 MED ORDER — CELECOXIB 200 MG PO CAPS
200.0000 mg | ORAL_CAPSULE | Freq: Two times a day (BID) | ORAL | Status: DC
  Administered 2024-11-24 – 2024-11-25 (×2): 200 mg via ORAL
  Filled 2024-11-24 (×2): qty 1

## 2024-11-24 MED ORDER — FENTANYL CITRATE (PF) 100 MCG/2ML IJ SOLN
INTRAMUSCULAR | Status: AC
  Filled 2024-11-24: qty 2

## 2024-11-24 MED ORDER — INSULIN ASPART 100 UNIT/ML IJ SOLN: 0.0000 [IU] | Freq: Three times a day (TID) | INTRAMUSCULAR | Status: DC

## 2024-11-24 MED ORDER — PHENOL 1.4 % MT LIQD
1.0000 | OROMUCOSAL | Status: DC | PRN
  Administered 2024-11-25: 10:00:00 1 via OROMUCOSAL
  Filled 2024-11-24: qty 177

## 2024-11-24 MED ORDER — SUGAMMADEX SODIUM 200 MG/2ML IV SOLN
INTRAVENOUS | Status: DC | PRN
  Administered 2024-11-24: 15:00:00 300 mg via INTRAVENOUS

## 2024-11-24 MED ORDER — ORAL CARE MOUTH RINSE: 15.0000 mL | Freq: Once | OROMUCOSAL | Status: AC

## 2024-11-24 MED ORDER — PROPOFOL 500 MG/50ML IV EMUL
INTRAVENOUS | Status: DC | PRN
  Administered 2024-11-24: 13:00:00 125 ug/kg/min via INTRAVENOUS

## 2024-11-24 MED ORDER — CHLORHEXIDINE GLUCONATE CLOTH 2 % EX PADS
6.0000 | MEDICATED_PAD | Freq: Once | CUTANEOUS | Status: AC
  Administered 2024-11-24: 11:00:00 6 via TOPICAL

## 2024-11-24 MED ORDER — GABAPENTIN 300 MG PO CAPS
300.0000 mg | ORAL_CAPSULE | ORAL | Status: DC
  Filled 2024-11-24: qty 1

## 2024-11-24 MED ORDER — GABAPENTIN 300 MG PO CAPS
600.0000 mg | ORAL_CAPSULE | Freq: Three times a day (TID) | ORAL | Status: DC
  Administered 2024-11-24 – 2024-11-25 (×3): 600 mg via ORAL
  Filled 2024-11-24 (×3): qty 2

## 2024-11-24 MED ORDER — ACETAMINOPHEN 500 MG PO TABS
1000.0000 mg | ORAL_TABLET | Freq: Four times a day (QID) | ORAL | Status: AC
  Administered 2024-11-24 – 2024-11-25 (×4): 1000 mg via ORAL
  Filled 2024-11-24 (×4): qty 2

## 2024-11-24 MED ORDER — HYDROMORPHONE HCL 1 MG/ML IJ SOLN
INTRAMUSCULAR | Status: AC
  Filled 2024-11-24: qty 1

## 2024-11-24 MED ORDER — OXYCODONE HCL 5 MG PO TABS
10.0000 mg | ORAL_TABLET | ORAL | Status: DC | PRN
  Administered 2024-11-24 – 2024-11-25 (×6): 10 mg via ORAL
  Filled 2024-11-24 (×6): qty 2

## 2024-11-24 MED ORDER — HYDROMORPHONE HCL 1 MG/ML IJ SOLN
INTRAMUSCULAR | Status: AC
  Administered 2024-11-24: 12:00:00 1 mg via INTRAVENOUS
  Filled 2024-11-24: qty 1

## 2024-11-24 MED ORDER — INSULIN ASPART 100 UNIT/ML IJ SOLN
0.0000 [IU] | Freq: Three times a day (TID) | INTRAMUSCULAR | Status: DC
  Administered 2024-11-25: 06:00:00 2 [IU] via SUBCUTANEOUS
  Filled 2024-11-24: qty 2

## 2024-11-24 MED ORDER — MIDAZOLAM HCL (PF) 2 MG/2ML IJ SOLN
INTRAMUSCULAR | Status: DC | PRN
  Administered 2024-11-24: 13:00:00 2 mg via INTRAVENOUS

## 2024-11-24 MED ORDER — OXYCODONE HCL 5 MG PO TABS: 5.0000 mg | ORAL_TABLET | Freq: Once | ORAL | Status: DC | PRN

## 2024-11-24 MED ORDER — ROCURONIUM BROMIDE 10 MG/ML (PF) SYRINGE
PREFILLED_SYRINGE | INTRAVENOUS | Status: DC | PRN
  Administered 2024-11-24: 15:00:00 15 mg via INTRAVENOUS
  Administered 2024-11-24: 14:00:00 20 mg via INTRAVENOUS
  Administered 2024-11-24: 13:00:00 60 mg via INTRAVENOUS

## 2024-11-24 MED ORDER — THROMBIN 20000 UNITS EX SOLR
CUTANEOUS | Status: AC
  Filled 2024-11-24: qty 20000

## 2024-11-24 MED ORDER — ONDANSETRON HCL 4 MG/2ML IJ SOLN
INTRAMUSCULAR | Status: DC | PRN
  Administered 2024-11-24: 15:00:00 4 mg via INTRAVENOUS

## 2024-11-24 MED ORDER — CEFAZOLIN SODIUM-DEXTROSE 2-4 GM/100ML-% IV SOLN
2.0000 g | Freq: Three times a day (TID) | INTRAVENOUS | Status: AC
  Administered 2024-11-24 – 2024-11-25 (×2): 2 g via INTRAVENOUS
  Filled 2024-11-24 (×2): qty 100

## 2024-11-24 MED ORDER — SODIUM CHLORIDE 0.9 % IV SOLN
250.0000 mL | INTRAVENOUS | Status: DC
  Administered 2024-11-24: 18:00:00 250 mL via INTRAVENOUS

## 2024-11-24 MED ORDER — KETAMINE HCL 50 MG/5ML IJ SOSY
PREFILLED_SYRINGE | INTRAMUSCULAR | Status: AC
  Filled 2024-11-24: qty 5

## 2024-11-24 MED ORDER — ACETAMINOPHEN 325 MG PO TABS
ORAL_TABLET | ORAL | Status: AC
  Filled 2024-11-24: qty 2

## 2024-11-24 MED ORDER — DROPERIDOL 2.5 MG/ML IJ SOLN: 0.6250 mg | Freq: Once | INTRAMUSCULAR | Status: DC | PRN

## 2024-11-24 MED ORDER — MORPHINE SULFATE (PF) 2 MG/ML IV SOLN: 2.0000 mg | INTRAVENOUS | Status: DC | PRN

## 2024-11-24 MED ORDER — BUPIVACAINE HCL (PF) 0.25 % IJ SOLN
INTRAMUSCULAR | Status: AC
  Filled 2024-11-24: qty 30

## 2024-11-24 MED ADMIN — SURGIPHOR / SURGIRINSE WOUND IRRIGATION SYSTEM - OPTIME: 450 mL | @ 13:00:00 | NDC 99999080156

## 2024-11-24 MED FILL — Ephedrine Sulf-NaCl PF Pref Syr 50 MG/10ML-0.9% (5 MG/ML): INTRAVENOUS | Qty: 5 | Status: AC

## 2024-11-24 MED FILL — Lidocaine HCl Local Soln Prefilled Syringe 100 MG/5ML (2%): INTRAMUSCULAR | Qty: 10 | Status: AC

## 2024-11-24 MED FILL — Ondansetron HCl Inj 4 MG/2ML (2 MG/ML): INTRAMUSCULAR | Qty: 6 | Status: AC

## 2024-11-24 MED FILL — Sugammadex Sodium IV 200 MG/2ML (Base Equivalent): INTRAVENOUS | Qty: 2 | Status: AC

## 2024-11-24 MED FILL — Rocuronium Bromide IV Soln Pref Syr 100 MG/10ML (10 MG/ML): INTRAVENOUS | Qty: 30 | Status: AC

## 2024-11-24 SURGICAL SUPPLY — 52 items
ALLOGRAFT BONESTRIP KORE 2.5X5 (Bone Implant) IMPLANT
BAG COUNTER SPONGE SURGICOUNT (BAG) ×1 IMPLANT
BASKET BONE COLLECTION (BASKET) IMPLANT
BENZOIN TINCTURE PRP APPL 2/3 (GAUZE/BANDAGES/DRESSINGS) ×1 IMPLANT
BLADE BONE MILL MEDIUM (MISCELLANEOUS) IMPLANT
BLADE CLIPPER SURG (BLADE) IMPLANT
BUR CARBIDE MATCH 3.0 (BURR) ×1 IMPLANT
CANISTER SUCTION 3000ML PPV (SUCTIONS) ×1 IMPLANT
CNTNR URN SCR LID CUP LEK RST (MISCELLANEOUS) ×1 IMPLANT
COVER BACK TABLE 60X90IN (DRAPES) ×1 IMPLANT
DERMABOND ADVANCED .7 DNX12 (GAUZE/BANDAGES/DRESSINGS) ×1 IMPLANT
DRAPE C-ARM 42X72 X-RAY (DRAPES) IMPLANT
DRAPE LAPAROTOMY 100X72X124 (DRAPES) ×1 IMPLANT
DRAPE SURG 17X23 STRL (DRAPES) ×1 IMPLANT
DRESSING MORCELLS FINE 1000 (Tissue) IMPLANT
DRSG AQUACEL AG ADV 3.5X 6 (GAUZE/BANDAGES/DRESSINGS) ×1 IMPLANT
DURAPREP 26ML APPLICATOR (WOUND CARE) ×1 IMPLANT
ELECTRODE REM PT RTRN 9FT ADLT (ELECTROSURGICAL) ×1 IMPLANT
EVACUATOR 1/8 PVC DRAIN (DRAIN) IMPLANT
GAUZE 4X4 16PLY ~~LOC~~+RFID DBL (SPONGE) IMPLANT
GLOVE BIO SURGEON STRL SZ7 (GLOVE) ×1 IMPLANT
GLOVE BIO SURGEON STRL SZ8 (GLOVE) ×2 IMPLANT
GLOVE BIOGEL PI IND STRL 7.0 (GLOVE) ×1 IMPLANT
GOWN STRL REUS W/ TWL LRG LVL3 (GOWN DISPOSABLE) IMPLANT
GOWN STRL REUS W/ TWL XL LVL3 (GOWN DISPOSABLE) ×2 IMPLANT
GOWN STRL REUS W/TWL 2XL LVL3 (GOWN DISPOSABLE) IMPLANT
GRAFT BN 5X1XSPNE CVD POST DBM (Bone Implant) IMPLANT
HEMOSTAT POWDER KIT SURGIFOAM (HEMOSTASIS) ×1 IMPLANT
KIT BASIN OR (CUSTOM PROCEDURE TRAY) ×1 IMPLANT
KIT INFUSE SMALL (Orthopedic Implant) IMPLANT
KIT TURNOVER KIT B (KITS) ×1 IMPLANT
MILL BONE PREP (MISCELLANEOUS) IMPLANT
NDL HYPO 25X1 1.5 SAFETY (NEEDLE) ×1 IMPLANT
NEEDLE HYPO 25X1 1.5 SAFETY (NEEDLE) ×1 IMPLANT
PACK LAMINECTOMY NEURO (CUSTOM PROCEDURE TRAY) ×1 IMPLANT
PAD ARMBOARD POSITIONER FOAM (MISCELLANEOUS) ×3 IMPLANT
RELINE-O MOD SHANK, 8.5X50MM TRACTION IMPLANT
ROD 5.5X40MM (Rod) IMPLANT
SCREW LOCK FXNS SPNE MAS PL (Screw) IMPLANT
SCREW SHANK RELINIE O 8.5X50 (Screw) IMPLANT
SCREW TULIP 5.5 (Screw) IMPLANT
SOLN 0.9% NACL POUR BTL 1000ML (IV SOLUTION) ×1 IMPLANT
SOLN STERILE WATER BTL 1000 ML (IV SOLUTION) ×1 IMPLANT
SOLUTION IRRIG SURGIPHOR (IV SOLUTION) ×1 IMPLANT
SPONGE SURGIFOAM ABS GEL 100 (HEMOSTASIS) ×1 IMPLANT
SPONGE T-LAP 4X18 ~~LOC~~+RFID (SPONGE) IMPLANT
STRIP CLOSURE SKIN 1/2X4 (GAUZE/BANDAGES/DRESSINGS) ×2 IMPLANT
SUT VIC AB 0 CT1 18XCR BRD8 (SUTURE) ×1 IMPLANT
SUT VIC AB 2-0 CP2 18 (SUTURE) ×1 IMPLANT
SUT VIC AB 3-0 SH 8-18 (SUTURE) ×2 IMPLANT
TOWEL GREEN STERILE (TOWEL DISPOSABLE) ×1 IMPLANT
TOWEL GREEN STERILE FF (TOWEL DISPOSABLE) ×1 IMPLANT

## 2024-11-24 NOTE — Anesthesia Postprocedure Evaluation (Signed)
 Anesthesia Post Note  Patient: Gabrielle Esparza  Procedure(s) Performed: LAMINECTOMY WITH POSTERIOR LATERAL ARTHRODESIS LEVEL 1 (Back)     Patient location during evaluation: PACU Anesthesia Type: General Level of consciousness: awake Pain management: pain level controlled Vital Signs Assessment: post-procedure vital signs reviewed and stable Respiratory status: spontaneous breathing, nonlabored ventilation and respiratory function stable Cardiovascular status: blood pressure returned to baseline and stable Postop Assessment: no apparent nausea or vomiting Anesthetic complications: no   No notable events documented.  Last Vitals:  Vitals:   11/24/24 1600 11/24/24 1615  BP: (!) 140/95 138/62  Pulse: 68 67  Resp: 13 12  Temp:    SpO2: 94% 95%    Last Pain:  Vitals:   11/24/24 1615  PainSc: Asleep   Pain Goal: Patients Stated Pain Goal: 3 (11/24/24 1059)  LLE Motor Response: Purposeful movement, Responds to commands (11/24/24 1600) LLE Sensation: Full sensation (11/24/24 1600) RLE Motor Response: Purposeful movement, Responds to commands (11/24/24 1600) RLE Sensation: Full sensation (11/24/24 1600)        Delon Aisha Arch

## 2024-11-24 NOTE — Plan of Care (Signed)

## 2024-11-24 NOTE — Op Note (Signed)
 11/24/2024  3:30 PM  PATIENT:  Gabrielle Esparza  68 y.o. female  PRE-OPERATIVE DIAGNOSIS: Suspected pseudoarthrosis L5-S1 with loosening of S1 instrumentation, back pain with right radiculopathy  POST-OPERATIVE DIAGNOSIS:  same  PROCEDURE:  1.  Exploration of fusion L5-S1 to confirm pseudoarthrosis with removal of loosened instrumentation, 2.  Posterolateral arthrodesis L5-S1 utilizing morselized allograft, 3.  Reinsertion of instrumentation L5-S1 utilizing [employer] pedicle screws  SURGEON:  Alm Molt, MD  ASSISTANTS: Gabrielle Pean, FNP  ANESTHESIA:   General  EBL: 50 ml  Total I/O In: 1000 [I.V.:1000] Out: 50 [Blood:50]  BLOOD ADMINISTERED: none  DRAINS: nmone  SPECIMEN:  none  INDICATION FOR PROCEDURE: This patient presented with neck pain and right leg pain. Imaging showed probable pseudoarthrosis L5-S1 with loosening of instrumentation at S1. The patient tried conservative measures without relief. Pain was debilitating. Recommended exploration of the fusion with redo posterolateral fusion L5-S1 with replacement of the instrumentation. Patient understood the risks, benefits, and alternatives and potential outcomes and wished to proceed.  PROCEDURE DETAILS: The patient was taken to the operating room and after induction of adequate generalized endotracheal anesthesia, the patient was rolled into the prone position on chest rolls and all pressure points were padded. The lumbar region was cleaned and then prepped with DuraPrep and draped in the usual sterile fashion. 10 cc of local anesthesia was injected and then the old incision was ellipsed out and the dissection was carried down to the lumbo sacral fascia. The fascia was opened and the paraspinous musculature was taken down in a subperiosteal fashion to expose the previously placed instrumentation at L5-S1.  I removed the locking caps from the previously placed screws and remove the rods.  The S1 pedicle screws were loose  within the pedicles.  The L5 pedicle screws had excellent purchase.  I remove the S1 pedicle screws.  These were 6.5 x 45 mm screws.  I dissected out and identified the transverse process of L5 bilaterally as well as the sacral ala.  These were decorticated and a mixture of morselized allograft was placed here to perform arthrodesis L5-S1.  I then palpated the old pedicle screw holes.  I used the pedicle probe to gently break through the anterior cortex so I could achieve bicortical purchase.  I then placed 8.5 x 50 mm pedicle screws at S1 bilaterally.  We checked our construct with AP and lateral fluoroscopy.  I placed lordotic rods into the multiaxial screw heads of the pedicle screws and locked these into position with the locking caps and the antitorque device.  I irrigated with 0.5% povidone iodine solution followed by saline solution. Achieved hemostasis with unipolar cautery and then closed the fascia with 0 Vicryl.  We then placed 1 g of myriad morsels into the large incision.  We did this because of her obese body habitus and the fact that she had had multiple surgeries and this was a redo surgery once again.  We did this to help with wound healing.  I closed the subcutaneous tissues with 2-0 Vicryl and the subcuticular tissues with 3-0 Vicryl. The skin was then closed with benzoin and Steri-Strips. The drapes were removed, a sterile dressing was applied.  My nurse practitioner was involved in the exposure, the reinsertion of the instrumentation, and the closure. the patient was awakened from general anesthesia and transferred to the recovery room in stable condition. At the end of the procedure all sponge, needle and instrument counts were correct.    PLAN OF CARE: Admit  to inpatient   PATIENT DISPOSITION:  PACU - hemodynamically stable.   Delay start of Pharmacological VTE agent (>24hrs) due to surgical blood loss or risk of bleeding:  yes

## 2024-11-24 NOTE — H&P (Signed)
 Subjective: Patient is a 68 y.o. female admitted for L5-S1 pseudoarthrosis. Onset of symptoms was several weeks ago, rapidly worsening since that time.  The pain is rated intense, and is located at the across the lower back and radiates to RLE. The pain is described as aching and occurs all day. The symptoms have been progressive. Symptoms are exacerbated by standing and walking for more than a few minutes. MRI or CT showed pseudoarthrosis L5-S1 with loosening of hardware   Past Medical History:  Diagnosis Date   Anxiety    Arthritis    OA,- hands (RA)& back     Asthma    Atherosclerosis    Constipation    Diabetes mellitus without complication (HCC)    type 2   DOE (dyspnea on exertion)    Fibromyalgia    takes cymbalta  and gabapentin    GERD (gastroesophageal reflux disease)    takes nexium   Headache    headaches and migraines   Hypertension    well-controlled   Low iron    hx of - no meds now   PONV (postoperative nausea and vomiting)    Sleep apnea    does not use CPAP    Past Surgical History:  Procedure Laterality Date   ABDOMINAL HYSTERECTOMY     APPENDECTOMY     BACK SURGERY     lumbar decompression    BILATERAL CARPAL TUNNEL RELEASE     BLADDER SUSPENSION     CHOLECYSTECTOMY     COLONOSCOPY     KNEE ARTHROPLASTY Left    LUMBAR LAMINECTOMY/DECOMPRESSION MICRODISCECTOMY Right 12/01/2015   Procedure:  extraforaminal Microdiscectomy  - Right Lumbar three-lumbar four ;  Surgeon: Alm GORMAN Molt, MD;  Location: [facility];  Service: Neurosurgery;  Laterality: Right;   POSTERIOR LUMBAR FUSION  04/28/2024   SHOULDER SURGERY Right    TRANSFORAMINAL LUMBAR INTERBODY FUSION (TLIF) WITH PEDICLE SCREW FIXATION 1 LEVEL Right 03/13/2016   Procedure: Right LUMBAR THREE- FOUR TRANSFORAMINAL LUMBAR INTERBODY FUSION (TLIF) WITH PEDICLE SCREW FIXATION ;  Surgeon: Alm GORMAN Molt, MD;  Location: [facility];  Service: Neurosurgery;  Laterality: Right;   TRIGGER FINGER RELEASE Right     index finger    Prior to Admission medications  Medication Sig Start Date End Date Taking? Authorizing Provider  albuterol  (VENTOLIN  HFA) 108 (90 Base) MCG/ACT inhaler Inhale 1 puff into the lungs every 4 (four) hours as needed for shortness of breath or wheezing.   Yes [provider]  busPIRone  (BUSPAR ) 10 MG tablet Take 10 mg by mouth 2 (two) times daily.   Yes [provider]  celecoxib  (CELEBREX ) 200 MG capsule Take 200 mg by mouth 2 (two) times daily.   Yes [provider]  dicyclomine  (BENTYL ) 10 MG capsule Take 10 mg by mouth in the morning, at noon, in the evening, and at bedtime. 09/14/24  Yes [provider]  esomeprazole (NEXIUM) 40 MG capsule Take 40 mg by mouth daily before breakfast.    Yes [provider]  furosemide  (LASIX ) 20 MG tablet Take 20 mg by mouth 2 (two) times daily.   Yes [provider]  gabapentin  (NEURONTIN ) 600 MG tablet Take 600 mg by mouth 3 (three) times daily.   Yes [provider]  HYDROcodone -acetaminophen  (NORCO) 10-325 MG tablet Take 1-2 tablets by mouth every 6 (six) hours as needed for moderate pain (pain score 4-6). 04/29/24  Yes Molt Alm Hamilton, MD  insulin  glargine (LANTUS ) 100 UNIT/ML injection Inject 30 Units  into the skin at bedtime.   Yes [provider]  ketoconazole (NIZORAL) 2 % shampoo Apply 1 Application topically 2 (two) times a week. 01/09/23  Yes [provider]  metFORMIN  (GLUCOPHAGE -XR) 500 MG 24 hr tablet Take 1,000 mg by mouth 2 (two) times daily.   Yes [provider]  methocarbamol  (ROBAXIN ) 500 MG tablet Take 1 tablet (500 mg total) by mouth every 8 (eight) hours as needed for muscle spasms. Patient taking differently: Take 500 mg by mouth 3 (three) times daily. 04/29/24  Yes Joshua Alm Hamilton, MD  metoprolol  tartrate (LOPRESSOR ) 25 MG tablet Take 25 mg by mouth 2 (two) times daily.   Yes [provider]  montelukast  (SINGULAIR ) 10 MG  tablet Take 10 mg by mouth daily.   Yes [provider]  PARoxetine  (PAXIL ) 30 MG tablet Take 30 mg by mouth every morning.   Yes [provider]  pioglitazone  (ACTOS ) 30 MG tablet Take 30 mg by mouth daily. 03/02/24  Yes [provider]  rOPINIRole  (REQUIP ) 0.5 MG tablet Take 1.5 mg by mouth at bedtime.   Yes [provider]  traZODone  (DESYREL ) 100 MG tablet Take 100 mg by mouth at bedtime as needed for sleep.   Yes [provider]  triamcinolone cream (KENALOG) 0.1 % Apply 1 Application topically daily.   Yes [provider]   Allergies[1]  Social History   Tobacco Use   Smoking status: Never   Smokeless tobacco: Never  Substance Use Topics   Alcohol use: Not Currently    Comment: rarely    Family History  Problem Relation Age of Onset   Cancer Mother    Heart attack Father      Review of Systems  Positive ROS: neg  All other systems have been reviewed and were otherwise negative with the exception of those mentioned in the HPI and as above.  Objective: Vital signs in last 24 hours: Temp:  [98.5 F (36.9 C)] 98.5 F (36.9 C) (12/17 1014) Pulse Rate:  [87] 87 (12/17 1014) Resp:  [20] 20 (12/17 1014) BP: (125)/(73) 125/73 (12/17 1014) SpO2:  [93 %] 93 % (12/17 1014) Weight:  [113.4 kg] 113.4 kg (12/17 1014)  General Appearance: Alert, cooperative, no distress, appears stated age, morbidly obese Head: Normocephalic, without obvious abnormality, atraumatic Eyes: PERRL, conjunctiva/corneas clear, EOM's intact    Neck: Supple, symmetrical, trachea midline Back: Symmetric, no curvature, ROM normal, no CVA tenderness Lungs:  respirations unlabored Heart: Regular rate and rhythm Abdomen: Soft, non-tender Extremities: Extremities normal, atraumatic, no cyanosis or edema Pulses: 2+ and symmetric all extremities Skin: Skin color, texture, turgor normal, no rashes or lesions  NEUROLOGIC:   Mental status: Alert and  oriented x4,  no aphasia, good attention span, fund of knowledge, and memory Motor Exam - grossly normal Sensory Exam - grossly normal Reflexes: trace Coordination - grossly normal Gait - grossly normal Balance - grossly normal Cranial Nerves: I: smell Not tested  II: visual acuity  OS: nl    OD: nl  II: visual fields Full to confrontation  II: pupils Equal, round, reactive to light  III,VII: ptosis None  III,IV,VI: extraocular muscles  Full ROM  V: mastication Normal  V: facial light touch sensation  Normal  V,VII: corneal reflex  Present  VII: facial muscle function - upper  Normal  VII: facial muscle function - lower Normal  VIII: hearing Not tested  IX: soft palate elevation  Normal  IX,X: gag reflex Present  XI:  trapezius strength  5/5  XI: sternocleidomastoid strength 5/5  XI: neck flexion strength  5/5  XII: tongue strength  Normal    Data Review Lab Results  Component Value Date   WBC 11.1 (H) 11/18/2024   HGB 11.5 (L) 11/18/2024   HCT 36.2 11/18/2024   MCV 97.6 11/18/2024   PLT 167 11/18/2024   Lab Results  Component Value Date   NA 139 11/18/2024   K 4.9 11/18/2024   CL 99 11/18/2024   CO2 28 11/18/2024   BUN 16 11/18/2024   CREATININE 1.59 (H) 11/18/2024   GLUCOSE 151 (H) 11/18/2024   Lab Results  Component Value Date   INR 1.0 04/01/2024    Assessment/Plan:  Estimated body mass index is 42.91 kg/m as calculated from the following:   Height as of this encounter: 5' 4 (1.626 m).   Weight as of this encounter: 113.4 kg. Morbidly obese   Patient admitted for re-do L5-S1 fusion. Patient has failed a reasonable attempt at conservative therapy.  I explained the condition and procedure to the patient and answered any questions.  Patient wishes to proceed with procedure as planned. Understands risks/ benefits and typical outcomes of procedure.   Alm GORMAN Molt 11/24/2024 12:25 PM      [1]  Allergies Allergen Reactions   Amoxicillin Itching  and Rash    unknown   Statins Nausea And Vomiting, Rash and Other (See Comments)    MYALGIAS   Cephalexin Nausea Only and Rash

## 2024-11-24 NOTE — Transfer of Care (Signed)
 Immediate Anesthesia Transfer of Care Note  Patient: Gabrielle Esparza  Procedure(s) Performed: LAMINECTOMY WITH POSTERIOR LATERAL ARTHRODESIS LEVEL 1 (Back)  Patient Location: PACU  Anesthesia Type:General  Level of Consciousness: awake and drowsy  Airway & Oxygen Therapy: Patient Spontanous Breathing and Patient connected to face mask oxygen  Post-op Assessment: Report given to RN and Post -op Vital signs reviewed and stable  Post vital signs: Reviewed and stable  Last Vitals:  Vitals Value Taken Time  BP 135/60 11/24/24 15:37  Temp    Pulse 73 11/24/24 15:40  Resp 17 11/24/24 15:40  SpO2 93 % 11/24/24 15:40  Vitals shown include unfiled device data.  Last Pain:  Vitals:   11/24/24 1144  PainSc: 10-Worst pain ever      Patients Stated Pain Goal: 3 (11/24/24 1059)  Complications: No notable events documented.

## 2024-11-25 ENCOUNTER — Encounter (HOSPITAL_COMMUNITY): Payer: Self-pay | Admitting: Neurological Surgery

## 2024-11-25 LAB — GLUCOSE, CAPILLARY: Glucose-Capillary: 159 mg/dL — ABNORMAL HIGH (ref 70–99)

## 2024-11-25 MED ORDER — METOPROLOL TARTRATE 25 MG PO TABS: 25.0000 mg | ORAL_TABLET | Freq: Two times a day (BID) | ORAL | Status: DC

## 2024-11-25 MED ORDER — ROPINIROLE HCL 1 MG PO TABS: 1.5000 mg | ORAL_TABLET | Freq: Every day | ORAL | Status: DC

## 2024-11-25 MED ORDER — ALBUTEROL SULFATE (2.5 MG/3ML) 0.083% IN NEBU: 2.5000 mg | INHALATION_SOLUTION | RESPIRATORY_TRACT | Status: DC | PRN

## 2024-11-25 MED ORDER — TRAZODONE HCL 50 MG PO TABS: 100.0000 mg | ORAL_TABLET | Freq: Every evening | ORAL | Status: DC | PRN

## 2024-11-25 MED ORDER — PANTOPRAZOLE SODIUM 40 MG PO TBEC
40.0000 mg | DELAYED_RELEASE_TABLET | Freq: Every day | ORAL | Status: DC
  Administered 2024-11-25: 09:00:00 40 mg via ORAL
  Filled 2024-11-25: qty 1

## 2024-11-25 MED ORDER — HYDROCODONE-ACETAMINOPHEN 10-325 MG PO TABS: 1.0000 | ORAL_TABLET | Freq: Four times a day (QID) | ORAL | 0 refills | Status: AC | PRN

## 2024-11-25 MED ORDER — FUROSEMIDE 20 MG PO TABS: 20.0000 mg | ORAL_TABLET | Freq: Two times a day (BID) | ORAL | Status: DC

## 2024-11-25 MED ORDER — BUSPIRONE HCL 10 MG PO TABS
10.0000 mg | ORAL_TABLET | Freq: Two times a day (BID) | ORAL | Status: DC
  Administered 2024-11-25: 09:00:00 10 mg via ORAL
  Filled 2024-11-25: qty 1

## 2024-11-25 MED ORDER — PAROXETINE HCL 30 MG PO TABS
30.0000 mg | ORAL_TABLET | Freq: Every morning | ORAL | Status: DC
  Administered 2024-11-25: 10:00:00 30 mg via ORAL
  Filled 2024-11-25: qty 1

## 2024-11-25 MED ORDER — MONTELUKAST SODIUM 10 MG PO TABS
10.0000 mg | ORAL_TABLET | Freq: Every day | ORAL | Status: DC
  Administered 2024-11-25: 09:00:00 10 mg via ORAL
  Filled 2024-11-25: qty 1

## 2024-11-25 MED ORDER — ONDANSETRON 4 MG PO TBDP: 4.0000 mg | ORAL_TABLET | Freq: Three times a day (TID) | ORAL | 0 refills | Status: AC | PRN

## 2024-11-25 MED ORDER — PIOGLITAZONE HCL 15 MG PO TABS
30.0000 mg | ORAL_TABLET | Freq: Every day | ORAL | Status: DC
  Administered 2024-11-25: 09:00:00 30 mg via ORAL
  Filled 2024-11-25: qty 2

## 2024-11-25 MED ORDER — METHOCARBAMOL 500 MG PO TABS: 500.0000 mg | ORAL_TABLET | Freq: Three times a day (TID) | ORAL | 1 refills | Status: AC | PRN

## 2024-11-25 MED ORDER — ALBUTEROL SULFATE HFA 108 (90 BASE) MCG/ACT IN AERS
1.0000 | INHALATION_SPRAY | RESPIRATORY_TRACT | Status: DC | PRN
  Filled 2024-11-25: qty 6.7

## 2024-11-25 NOTE — Discharge Instructions (Addendum)
 Recommend follow-up with Lauraine Estefana Lent DO to address metformin  dosing and lab assessment. If you have any muscle cramps, somnolence, abdominal pain, malaise- contact your PCP.

## 2024-11-25 NOTE — Evaluation (Signed)
 Physical Therapy Evaluation  Patient Details Name: Gabrielle Esparza MRN: [id number] DOB: 13-Jan-1956 Today's Date: 11/25/2024  History of Present Illness  Pt is a 68 y/o female presenting on 12/17 for same day redo PLIF L5-S1. PHMx: anxiety, OA/RA (hand and back), DM2, DOE, fibromyalgia, HTN, prior lumbar surgery, R shoulder surgery, bil carpal tunnel release.  Clinical Impression  Pt admitted with above diagnosis. At the time of PT eval, pt was able to demonstrate transfers and ambulation with gross supervision for safety and RW for support. Pt was educated on precautions, brace application/wearing schedule, appropriate activity progression, and car transfer. Pt currently with functional limitations due to the deficits listed below (see PT Problem List). Pt will benefit from skilled PT to increase their independence and safety with mobility to allow discharge to the venue listed below.          If plan is discharge home, recommend the following: A little help with walking and/or transfers;A little help with bathing/dressing/bathroom;Assistance with cooking/housework;Assist for transportation;Help with stairs or ramp for entrance   Can travel by private vehicle        Equipment Recommendations None recommended by PT  Recommendations for Other Services       Functional Status Assessment Patient has had a recent decline in their functional status and demonstrates the ability to make significant improvements in function in a reasonable and predictable amount of time.     Precautions / Restrictions Precautions Precautions: Fall;Back Precaution Booklet Issued: Yes (comment) Recall of Precautions/Restrictions: Intact Precaution/Restrictions Comments: Reviewed handout and pt was cued for precautions during functional mobility. Required Braces or Orthoses: Spinal Brace Spinal Brace: Lumbar corset (pt left brace at home.) Restrictions Weight Bearing Restrictions Per Provider Order: No       Mobility  Bed Mobility Overal bed mobility: Modified Independent             General bed mobility comments: HOB flat and rails lowered to simulate home environment. No assist required.    Transfers Overall transfer level: Needs assistance Equipment used: Rolling walker (2 wheels) Transfers: Sit to/from Stand Sit to Stand: Supervision           General transfer comment: VC's for hand placement on seated surface for safety.    Ambulation/Gait Ambulation/Gait assistance: Supervision Gait Distance (Feet): 300 Feet Assistive device: Rolling walker (2 wheels) Gait Pattern/deviations: Step-through pattern, Decreased stride length, Trunk flexed, Narrow base of support Gait velocity: Decreased Gait velocity interpretation: <1.31 ft/sec, indicative of household ambulator   General Gait Details: VC's for improved posture, closer walker proximity and forward gaze. No assist required.  Stairs Stairs:  (Deferred as pt has a ramp at home)          Wheelchair Mobility     Tilt Bed    Modified Rankin (Stroke Patients Only)       Balance Overall balance assessment: Needs assistance Sitting-balance support: No upper extremity supported, Feet supported Sitting balance-Leahy Scale: Good     Standing balance support: Bilateral upper extremity supported, During functional activity, No upper extremity supported Standing balance-Leahy Scale: Poor Standing balance comment: relies on RW                             Pertinent Vitals/Pain Pain Assessment Pain Assessment: Faces Faces Pain Scale: Hurts little more Pain Location: back, L hip Pain Descriptors / Indicators: Discomfort, Operative site guarding Pain Intervention(s): Limited activity within patient's tolerance, Monitored during session, Repositioned  Home Living Family/patient expects to be discharged to:: Private residence Living Arrangements: Spouse/significant other;Children;Other  relatives Available Help at Discharge: Family;Available 24 hours/day Type of Home: House Home Access: Ramped entrance       Home Layout: One level Home Equipment: Hand held shower head;Tub bench;Rolling Walker (2 wheels);Rollator (4 wheels);BSC/3in1;Cane - single point;Wheelchair - manual      Prior Function Prior Level of Function : Independent/Modified Independent             Mobility Comments: using RW for mobility ADLs Comments: reports needing assist for safety with tub transfers and IADLs     Extremity/Trunk Assessment   Upper Extremity Assessment Upper Extremity Assessment: Overall WFL for tasks assessed    Lower Extremity Assessment Lower Extremity Assessment: Generalized weakness;LLE deficits/detail (Mild; consistent with pre-op diagnosis) LLE Deficits / Details: L hip pain    Cervical / Trunk Assessment Cervical / Trunk Assessment: Back Surgery;Other exceptions Cervical / Trunk Exceptions: increased body habitus  Communication   Communication Communication: No apparent difficulties    Cognition Arousal: Alert Behavior During Therapy: WFL for tasks assessed/performed                             Following commands: Intact       Cueing Cueing Techniques: Verbal cues     General Comments General comments (skin integrity, edema, etc.): dressing peeling off and incision draining, RN notified and changed dressing    Exercises     Assessment/Plan    PT Assessment Patient needs continued PT services  PT Problem List Decreased strength;Decreased activity tolerance;Decreased balance;Decreased mobility;Decreased knowledge of use of DME;Decreased safety awareness;Decreased knowledge of precautions;Pain       PT Treatment Interventions DME instruction;Gait training;Therapeutic activities;Functional mobility training;Therapeutic exercise;Balance training;Patient/family education    PT Goals (Current goals can be found in the Care Plan section)   Acute Rehab PT Goals Patient Stated Goal: Home at d/c today PT Goal Formulation: With patient/family Time For Goal Achievement: 12/02/24 Potential to Achieve Goals: Fair    Frequency Min 5X/week     Co-evaluation               AM-PAC PT 6 Clicks Mobility  Outcome Measure Help needed turning from your back to your side while in a flat bed without using bedrails?: None Help needed moving from lying on your back to sitting on the side of a flat bed without using bedrails?: None Help needed moving to and from a bed to a chair (including a wheelchair)?: A Little Help needed standing up from a chair using your arms (e.g., wheelchair or bedside chair)?: A Little Help needed to walk in hospital room?: A Little Help needed climbing 3-5 steps with a railing? : A Little 6 Click Score: 20    End of Session Equipment Utilized During Treatment: Gait belt Activity Tolerance: Patient tolerated treatment well Patient left: in bed;with call bell/phone within reach;with family/visitor present Nurse Communication: Mobility status PT Visit Diagnosis: Unsteadiness on feet (R26.81);Pain Pain - part of body:  (back)    Time: 1045-1100 PT Time Calculation (min) (ACUTE ONLY): 15 min   Charges:   PT Evaluation $PT Eval Low Complexity: 1 Low   PT General Charges $$ ACUTE PT VISIT: 1 Visit         Leita Sable, PT, DPT Acute Rehabilitation Services Secure Chat Preferred Office: [phone]   Leita JONETTA Sable 11/25/2024, 11:08 AM

## 2024-11-25 NOTE — Progress Notes (Signed)
 Patient alert and oriented, ambulated, voided. Surgical dressing change couple of time. Extra dressing supplies given to the patient and family. D/c instructions explain and given  to the patient and husband, all questions answered. Patient d/c home per order.

## 2024-11-25 NOTE — Discharge Summary (Addendum)
 Physician Discharge Summary  Patient ID: Gabrielle Esparza MRN: [id number] DOB/AGE: 1956-08-12 68 y.o.  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 The patient tolerated the procedure well and was taken to the recovery room and then to the floor in stable condition. The hospital course was routine. There were no complications. The wound remained clean dry and intact. Pt had appropriate back soreness. No complaints of leg pain or new N/T/W. The patient remained afebrile with stable vital signs, and tolerated a regular diet. The patient continued to increase activities, and pain was well controlled with oral pain medications.   Consults: None  Significant Diagnostic Studies:  Results for orders placed or performed during the hospital encounter of 11/24/24  Glucose, capillary   Collection Time: 11/24/24 10:18 AM  Result Value Ref Range   Glucose-Capillary 146 (H) 70 - 99 mg/dL  Glucose, capillary   Collection Time: 11/24/24  3:39 PM  Result Value Ref Range   Glucose-Capillary 109 (H) 70 - 99 mg/dL  Glucose, capillary   Collection Time: 11/24/24  9:17 PM  Result Value Ref Range   Glucose-Capillary 240 (H) 70 - 99 mg/dL  Glucose, capillary   Collection Time: 11/25/24  5:56 AM  Result Value Ref Range   Glucose-Capillary 159 (H) 70 - 99 mg/dL   Comment 1 Notify RN    Comment 2 Document in Chart     DG Lumbar Spine 2-3 Views Result Date: 11/24/2024 EXAM: FLUOROSCOPIC IMAGING TECHNIQUE: Fluoroscopy was provided by the radiology department for procedure. Radiologist was not present during examination. RADIATION DOSE INDEX: Reference Air Kerma:  9.06 mGy COMPARISON: None available. CLINICAL HISTORY: 461500 Elective surgery 461500 Elective surgery 461500 FINDINGS: Intraoperative fluoroscopic imaging was performed. Pedicle screws are noted at L3, L4, L5, and S1. Interbody fusion is seen at L3-L4, L4-L5, and L5-S1. No hardware abnormality. IMPRESSION: 1. Intraoperative fluoroscopic imaging as above. Please refer to the operative report for full details. Electronically signed by: Oneil Devonshire MD 11/24/2024 07:20 PM EST RP Workstation: MYRTICE BARE C-Arm 1-60 Min-No Report Result Date: 11/24/2024 Fluoroscopy was utilized by the requesting physician.  No radiographic interpretation.    Antibiotics:  Anti-infectives (From admission, onward)    Start     Dose/Rate Route Frequency Ordered Stop   11/24/24 1930  ceFAZolin  (ANCEF ) IVPB 2g/100 mL premix        2 g 200 mL/hr over 30 Minutes Intravenous Every 8 hours 11/24/24 1710 11/25/24 0735   11/24/24 1015  ceFAZolin  (ANCEF ) IVPB 2g/100 mL premix        2 g 200 mL/hr over 30 Minutes Intravenous On call to O.R. 11/24/24 1010 11/24/24 1340       Discharge Exam: Blood pressure (!) 94/54, pulse 71, temperature 98.1 F (36.7 C), temperature source Oral, resp. rate 18, height 5' 4 (1.626 m), weight 113.4 kg, SpO2 96%. Neurologic: Grossly normal Ambulating and voiding well incision cdi   Discharge Medications:   Allergies as of 11/25/2024       Reactions   Amoxicillin Itching, Rash   unknown   Statins Nausea And Vomiting, Rash, Other (See Comments)   MYALGIAS   Cephalexin Nausea Only, Rash  Medication List     TAKE these medications    rOPINIRole  0.5 MG tablet Commonly known as: REQUIP  Take 1.5 mg by mouth at bedtime. The timing of this medication is very important.   albuterol  108 (90 Base) MCG/ACT inhaler Commonly known as: VENTOLIN  HFA Inhale 1 puff into the lungs every 4 (four) hours as needed for shortness of breath or wheezing.   busPIRone  10 MG  tablet Commonly known as: BUSPAR  Take 10 mg by mouth 2 (two) times daily.   celecoxib  200 MG capsule Commonly known as: CELEBREX  Take 200 mg by mouth 2 (two) times daily.   dicyclomine  10 MG capsule Commonly known as: BENTYL  Take 10 mg by mouth in the morning, at noon, in the evening, and at bedtime.   esomeprazole 40 MG capsule Commonly known as: NEXIUM Take 40 mg by mouth daily before breakfast.   furosemide  20 MG tablet Commonly known as: LASIX  Take 20 mg by mouth 2 (two) times daily.   gabapentin  600 MG tablet Commonly known as: NEURONTIN  Take 600 mg by mouth 3 (three) times daily.   HYDROcodone -acetaminophen  10-325 MG tablet Commonly known as: NORCO Take 1 tablet by mouth every 6 (six) hours as needed for moderate pain (pain score 4-6). What changed: how much to take   insulin  glargine 100 UNIT/ML injection Commonly known as: LANTUS  Inject 30 Units into the skin at bedtime.   ketoconazole 2 % shampoo Commonly known as: NIZORAL Apply 1 Application topically 2 (two) times a week.   metFORMIN  500 MG 24 hr tablet Commonly known as: GLUCOPHAGE -XR Take 1,000 mg by mouth 2 (two) times daily.   methocarbamol  500 MG tablet Commonly known as: ROBAXIN  Take 1 tablet (500 mg total) by mouth every 8 (eight) hours as needed for muscle spasms. What changed:  when to take this reasons to take this   metoprolol  tartrate 25 MG tablet Commonly known as: LOPRESSOR  Take 25 mg by mouth 2 (two) times daily.   montelukast  10 MG tablet Commonly known as: SINGULAIR  Take 10 mg by mouth daily.   ondansetron  4 MG disintegrating tablet Commonly known as: ZOFRAN -ODT Take 1 tablet (4 mg total) by mouth every 8 (eight) hours as needed for nausea or vomiting.   PARoxetine  30 MG tablet Commonly known as: PAXIL  Take 30 mg by mouth every morning.   pioglitazone  30 MG tablet Commonly known as: ACTOS  Take 30 mg by mouth daily.   traZODone  100 MG tablet Commonly known as:  DESYREL  Take 100 mg by mouth at bedtime as needed for sleep.   triamcinolone cream 0.1 % Commonly known as: KENALOG Apply 1 Application topically daily.               Durable Medical Equipment  (From admission, onward)           Start     Ordered   11/24/24 1711  DME Walker rolling  Once       Question:  Patient needs a walker to treat with the following condition  Answer:  S/P lumbar fusion   11/24/24 1710   11/24/24 1711  DME 3 n 1  Once        11/24/24 1710            Disposition: home   Final Dx: redo posterior lateral fusion L5-S1  Discharge Instructions      Remove dressing in 72 hours   Complete by: As directed     Remove dressing in 72 hours   Complete  by: As directed    Call MD for:   Complete by: As directed    Call MD for:   Complete by: As directed    Call MD for:  difficulty breathing, headache or visual disturbances   Complete by: As directed    Call MD for:  difficulty breathing, headache or visual disturbances   Complete by: As directed    Call MD for:  hives   Complete by: As directed    Call MD for:  hives   Complete by: As directed    Call MD for:  persistant dizziness or light-headedness   Complete by: As directed    Call MD for:  persistant nausea and vomiting   Complete by: As directed    Call MD for:  persistant nausea and vomiting   Complete by: As directed    Call MD for:  redness, tenderness, or signs of infection (pain, swelling, redness, odor or green/yellow discharge around incision site)   Complete by: As directed    Call MD for:  redness, tenderness, or signs of infection (pain, swelling, redness, odor or green/yellow discharge around incision site)   Complete by: As directed    Call MD for:  severe uncontrolled pain   Complete by: As directed    Call MD for:  severe uncontrolled pain   Complete by: As directed    Call MD for:  temperature >100.4   Complete by: As directed    Call MD for:  temperature >100.4    Complete by: As directed    Driving Restrictions   Complete by: As directed    No driving for 2 weeks, no riding in the car for 1 week   Driving Restrictions   Complete by: As directed    No driving for 2 weeks, no riding in the car for 1 week   Increase activity slowly   Complete by: As directed    Increase activity slowly   Complete by: As directed    Lifting restrictions   Complete by: As directed    No lifting more than 8 lbs   Lifting restrictions   Complete by: As directed    No lifting more than 8 lbs          Signed: Militza Devery Maxmillian Carsey 11/25/2024, 11:32 AM

## 2024-11-25 NOTE — Evaluation (Signed)
 Occupational Therapy Evaluation Patient Details Name: Gabrielle Esparza MRN: [id number] DOB: 22-May-1956 Today's Date: 11/25/2024   History of Present Illness   Pt is a 68 y/o female presenting on 12/17 for same day redo PLIF L5-S1. PHMx: anxiety, OA/RA (hand and back), DM2, DOE, fibromyalgia, HTN, prior lumbar surgery, R shoulder surgery, bil carpal tunnel release.     Clinical Impressions Patient admitted for above and presents with problem list below.  PTA pt was using RW for mobility with modified independence, needing supervision for some ADLs. Patient was educated on brace mgmt and wear schedule (brace not present during eval and limited session to bathroom only), back precautions, ADL compensatory techniques, AE/DME, mobility progression, safety and recommendations.  Today, pt demonstrated ability to complete bed mobility with modified independence, transfers using RW with supervision, functional mobility using RW with supervision, and ADLs with up to supervision.  Therapist demonstrated tub transfers using TTB, pt will benefit from further practice. At discharge, pt will have support from spouse.  Based on performance today, pt will benefit from continued OT services acutely to optimize independence and safety.  OT will follow acutely but anticipate no further needs after dc home.       If plan is discharge home, recommend the following:   A little help with walking and/or transfers;A little help with bathing/dressing/bathroom;Assistance with cooking/housework;Assist for transportation     Functional Status Assessment   Patient has had a recent decline in their functional status and demonstrates the ability to make significant improvements in function in a reasonable and predictable amount of time.     Equipment Recommendations   None recommended by OT     Recommendations for Other Services         Precautions/Restrictions   Precautions Precautions:  Fall;Back Precaution Booklet Issued: Yes (comment) Recall of Precautions/Restrictions: Intact Precaution/Restrictions Comments: reviewed with pt, requires cueing to recall lifting restriction at end of session Required Braces or Orthoses: Spinal Brace Spinal Brace: Lumbar corset (pt has brace at home- mobility to bathroom only during OT)     Mobility Bed Mobility Overal bed mobility: Modified Independent             General bed mobility comments: log roll technique without assist    Transfers Overall transfer level: Needs assistance Equipment used: Rolling walker (2 wheels) Transfers: Sit to/from Stand Sit to Stand: Supervision           General transfer comment: cueing for hand placement and posture      Balance Overall balance assessment: Needs assistance Sitting-balance support: No upper extremity supported, Feet supported Sitting balance-Leahy Scale: Good     Standing balance support: Bilateral upper extremity supported, During functional activity, No upper extremity supported Standing balance-Leahy Scale: Poor Standing balance comment: relies on RW                           ADL either performed or assessed with clinical judgement   ADL Overall ADL's : Needs assistance/impaired     Grooming: Supervision/safety;Standing           Upper Body Dressing : Set up;Sitting   Lower Body Dressing: Supervision/safety;Sit to/from stand Lower Body Dressing Details (indicate cue type and reason): figure 4 technique at EOB, supervison for safety Toilet Transfer: Supervision/safety;Ambulation;Rolling walker (2 wheels)   Toileting- Clothing Manipulation and Hygiene: Supervision/safety;Cueing for compensatory techniques;Cueing for back precautions;Sit to/from stand;Sitting/lateral lean     Tub/Shower Transfer Details (indicate cue type and  reason): therapist simulated use of TTB with sit and scoot into tub without twisting, pt will benefit from further  practice Functional mobility during ADLs: Supervision/safety;Rolling walker (2 wheels)       Vision   Vision Assessment?: No apparent visual deficits     Perception         Praxis         Pertinent Vitals/Pain Pain Assessment Pain Assessment: Faces Faces Pain Scale: Hurts a little bit Pain Location: back Pain Descriptors / Indicators: Discomfort, Operative site guarding Pain Intervention(s): Limited activity within patient's tolerance, Monitored during session, Repositioned     Extremity/Trunk Assessment Upper Extremity Assessment Upper Extremity Assessment: Overall WFL for tasks assessed   Lower Extremity Assessment Lower Extremity Assessment: Defer to PT evaluation   Cervical / Trunk Assessment Cervical / Trunk Assessment: Back Surgery;Other exceptions Cervical / Trunk Exceptions: increased body habitus   Communication Communication Communication: No apparent difficulties   Cognition Arousal: Alert Behavior During Therapy: WFL for tasks assessed/performed Cognition: No apparent impairments             OT - Cognition Comments: decreased probelm solving, decreased recall of precautions but likely due to pain meds                 Following commands: Intact       Cueing  General Comments   Cueing Techniques: Verbal cues  dressing peeling off and incision draining, RN notified and changed dressing   Exercises     Shoulder Instructions      Home Living Family/patient expects to be discharged to:: Private residence Living Arrangements: Spouse/significant other;Children;Other relatives Available Help at Discharge: Family;Available 24 hours/day Type of Home: House Home Access: Ramped entrance     Home Layout: One level     Bathroom Shower/Tub: Tub/shower unit;Curtain   Bathroom Toilet: Standard Bathroom Accessibility: Yes   Home Equipment: Hand held shower head;Tub bench;Rolling Walker (2 wheels);Rollator (4 wheels);BSC/3in1;Cane -  single point;Wheelchair - manual          Prior Functioning/Environment Prior Level of Function : Independent/Modified Independent             Mobility Comments: using RW for mobility ADLs Comments: reports needing assist for safety with tub transfers and IADLs    OT Problem List: Decreased activity tolerance;Impaired balance (sitting and/or standing);Decreased knowledge of use of DME or AE;Decreased knowledge of precautions;Pain;Obesity   OT Treatment/Interventions: Self-care/ADL training;Therapeutic exercise;DME and/or AE instruction;Therapeutic activities;Balance training;Patient/family education      OT Goals(Current goals can be found in the care plan section)   Acute Rehab OT Goals Patient Stated Goal: get home OT Goal Formulation: With patient Time For Goal Achievement: 12/09/24 Potential to Achieve Goals: Good   OT Frequency:  Min 2X/week    Co-evaluation              AM-PAC OT 6 Clicks Daily Activity     Outcome Measure Help from another person eating meals?: None Help from another person taking care of personal grooming?: A Little Help from another person toileting, which includes using toliet, bedpan, or urinal?: A Little Help from another person bathing (including washing, rinsing, drying)?: A Little Help from another person to put on and taking off regular upper body clothing?: A Little Help from another person to put on and taking off regular lower body clothing?: A Little 6 Click Score: 19   End of Session Equipment Utilized During Treatment: Rolling walker (2 wheels) Nurse Communication: Mobility status;Precautions  Activity Tolerance:  Patient tolerated treatment well Patient left: with call bell/phone within reach;in bed  OT Visit Diagnosis: Other abnormalities of gait and mobility (R26.89);Muscle weakness (generalized) (M62.81);Pain Pain - part of body:  (back)                Time: 9163-9091 OT Time Calculation (min): 32 min Charges:   OT General Charges $OT Visit: 1 Visit OT Evaluation $OT Eval Low Complexity: 1 Low OT Treatments $Self Care/Home Management : 8-22 mins  Etta NOVAK, OT Acute Rehabilitation Services Office [phone] Secure Chat Preferred    Etta GORMAN Hope 11/25/2024, 9:19 AM

## 2024-12-15 ENCOUNTER — Encounter (HOSPITAL_COMMUNITY): Payer: Self-pay | Admitting: Neurological Surgery

## 2024-12-24 ENCOUNTER — Other Ambulatory Visit (HOSPITAL_BASED_OUTPATIENT_CLINIC_OR_DEPARTMENT_OTHER): Payer: Self-pay | Admitting: Student

## 2024-12-24 DIAGNOSIS — M4316 Spondylolisthesis, lumbar region: Secondary | ICD-10-CM

## 2024-12-28 ENCOUNTER — Ambulatory Visit (HOSPITAL_BASED_OUTPATIENT_CLINIC_OR_DEPARTMENT_OTHER)
Admission: RE | Admit: 2024-12-28 | Discharge: 2024-12-28 | Disposition: A | Discharge status: Home / Self Care | Source: Ambulatory Visit | Attending: Student | Admitting: Student

## 2024-12-28 DIAGNOSIS — M4316 Spondylolisthesis, lumbar region: Secondary | ICD-10-CM | POA: Insufficient documentation

## 2025-01-14 ENCOUNTER — Other Ambulatory Visit (HOSPITAL_BASED_OUTPATIENT_CLINIC_OR_DEPARTMENT_OTHER): Payer: Self-pay | Admitting: Neurological Surgery

## 2025-01-14 DIAGNOSIS — M5416 Radiculopathy, lumbar region: Secondary | ICD-10-CM
